# Patient Record
Sex: Male | Born: 1955 | Race: White | Hispanic: No | Smoking: Current every day smoker
Health system: Southern US, Community
[De-identification: ages and names within clinical notes are randomized; demographics above are authoritative.]

## PROBLEM LIST (undated history)

## (undated) DIAGNOSIS — F1010 Alcohol abuse, uncomplicated: Secondary | ICD-10-CM

## (undated) DIAGNOSIS — F13239 Sedative, hypnotic or anxiolytic dependence with withdrawal, unspecified: Secondary | ICD-10-CM

## (undated) DIAGNOSIS — F13939 Sedative, hypnotic or anxiolytic use, unspecified with withdrawal, unspecified: Secondary | ICD-10-CM

## (undated) DIAGNOSIS — R569 Unspecified convulsions: Secondary | ICD-10-CM

## (undated) DIAGNOSIS — J449 Chronic obstructive pulmonary disease, unspecified: Secondary | ICD-10-CM

## (undated) DIAGNOSIS — M5030 Other cervical disc degeneration, unspecified cervical region: Secondary | ICD-10-CM

## (undated) DIAGNOSIS — E119 Type 2 diabetes mellitus without complications: Secondary | ICD-10-CM

## (undated) HISTORY — PX: BACK SURGERY: SHX140

## (undated) HISTORY — PX: NECK SURGERY: SHX720

---

## 2000-06-03 ENCOUNTER — Emergency Department (HOSPITAL_COMMUNITY): Admission: EM | Admit: 2000-06-03 | Discharge: 2000-06-03 | Payer: Self-pay | Admitting: Emergency Medicine

## 2000-06-27 ENCOUNTER — Encounter: Admission: RE | Admit: 2000-06-27 | Discharge: 2000-09-25 | Payer: Self-pay | Admitting: Unknown Physician Specialty

## 2001-05-18 ENCOUNTER — Encounter: Payer: Self-pay | Admitting: Emergency Medicine

## 2001-05-18 ENCOUNTER — Inpatient Hospital Stay (HOSPITAL_COMMUNITY): Admission: EM | Admit: 2001-05-18 | Discharge: 2001-05-23 | Payer: Self-pay | Admitting: *Deleted

## 2001-06-19 ENCOUNTER — Inpatient Hospital Stay (HOSPITAL_COMMUNITY): Admission: EM | Admit: 2001-06-19 | Discharge: 2001-06-25 | Payer: Self-pay | Admitting: Psychiatry

## 2001-11-07 ENCOUNTER — Encounter: Admission: RE | Admit: 2001-11-07 | Discharge: 2001-11-07 | Payer: Self-pay | Admitting: Sports Medicine

## 2001-11-07 ENCOUNTER — Encounter: Payer: Self-pay | Admitting: Sports Medicine

## 2001-11-21 ENCOUNTER — Encounter: Admission: RE | Admit: 2001-11-21 | Discharge: 2001-11-21 | Payer: Self-pay | Admitting: Sports Medicine

## 2001-11-21 ENCOUNTER — Encounter: Payer: Self-pay | Admitting: Sports Medicine

## 2001-12-05 ENCOUNTER — Encounter: Payer: Self-pay | Admitting: Sports Medicine

## 2001-12-05 ENCOUNTER — Encounter: Admission: RE | Admit: 2001-12-05 | Discharge: 2001-12-05 | Payer: Self-pay | Admitting: Sports Medicine

## 2002-09-27 ENCOUNTER — Ambulatory Visit (HOSPITAL_COMMUNITY): Admission: RE | Admit: 2002-09-27 | Discharge: 2002-09-28 | Payer: Self-pay | Admitting: Neurosurgery

## 2003-01-01 ENCOUNTER — Encounter: Payer: Self-pay | Admitting: Radiology

## 2003-01-01 ENCOUNTER — Encounter: Admission: RE | Admit: 2003-01-01 | Discharge: 2003-01-01 | Payer: Self-pay | Admitting: Neurosurgery

## 2003-01-18 ENCOUNTER — Encounter: Admission: RE | Admit: 2003-01-18 | Discharge: 2003-01-18 | Payer: Self-pay | Admitting: Neurosurgery

## 2003-04-25 ENCOUNTER — Inpatient Hospital Stay (HOSPITAL_COMMUNITY): Admission: RE | Admit: 2003-04-25 | Discharge: 2003-04-26 | Payer: Self-pay | Admitting: Neurosurgery

## 2003-10-12 ENCOUNTER — Ambulatory Visit (HOSPITAL_COMMUNITY): Admission: RE | Admit: 2003-10-12 | Discharge: 2003-10-12 | Payer: Self-pay | Admitting: Neurosurgery

## 2003-11-11 ENCOUNTER — Encounter
Admission: RE | Admit: 2003-11-11 | Discharge: 2004-02-09 | Payer: Self-pay | Admitting: Physical Medicine & Rehabilitation

## 2004-01-10 ENCOUNTER — Emergency Department (HOSPITAL_COMMUNITY): Admission: EM | Admit: 2004-01-10 | Discharge: 2004-01-10 | Payer: Self-pay | Admitting: Family Medicine

## 2004-01-14 ENCOUNTER — Encounter: Admission: RE | Admit: 2004-01-14 | Discharge: 2004-01-14 | Payer: Self-pay | Admitting: Neurosurgery

## 2004-01-29 ENCOUNTER — Encounter: Admission: RE | Admit: 2004-01-29 | Discharge: 2004-01-29 | Payer: Self-pay | Admitting: Neurosurgery

## 2004-02-03 ENCOUNTER — Emergency Department (HOSPITAL_COMMUNITY): Admission: EM | Admit: 2004-02-03 | Discharge: 2004-02-03 | Payer: Self-pay | Admitting: Emergency Medicine

## 2004-02-04 ENCOUNTER — Inpatient Hospital Stay (HOSPITAL_COMMUNITY): Admission: EM | Admit: 2004-02-04 | Discharge: 2004-02-06 | Payer: Self-pay | Admitting: Emergency Medicine

## 2004-02-14 ENCOUNTER — Encounter: Admission: RE | Admit: 2004-02-14 | Discharge: 2004-02-14 | Payer: Self-pay | Admitting: Neurosurgery

## 2004-02-21 ENCOUNTER — Emergency Department (HOSPITAL_COMMUNITY): Admission: EM | Admit: 2004-02-21 | Discharge: 2004-02-21 | Payer: Self-pay | Admitting: *Deleted

## 2004-04-30 ENCOUNTER — Emergency Department (HOSPITAL_COMMUNITY): Admission: EM | Admit: 2004-04-30 | Discharge: 2004-04-30 | Payer: Self-pay | Admitting: Emergency Medicine

## 2004-05-11 ENCOUNTER — Ambulatory Visit (HOSPITAL_COMMUNITY): Admission: RE | Admit: 2004-05-11 | Discharge: 2004-05-11 | Payer: Self-pay | Admitting: Neurosurgery

## 2004-05-14 ENCOUNTER — Emergency Department (HOSPITAL_COMMUNITY): Admission: AC | Admit: 2004-05-14 | Discharge: 2004-05-14 | Payer: Self-pay | Admitting: Emergency Medicine

## 2004-05-24 ENCOUNTER — Emergency Department (HOSPITAL_COMMUNITY): Admission: EM | Admit: 2004-05-24 | Discharge: 2004-05-24 | Payer: Self-pay | Admitting: Emergency Medicine

## 2005-02-03 ENCOUNTER — Emergency Department (HOSPITAL_COMMUNITY): Admission: EM | Admit: 2005-02-03 | Discharge: 2005-02-03 | Payer: Self-pay | Admitting: *Deleted

## 2006-05-02 ENCOUNTER — Ambulatory Visit: Payer: Self-pay | Admitting: Emergency Medicine

## 2006-05-02 ENCOUNTER — Inpatient Hospital Stay (HOSPITAL_COMMUNITY): Admission: EM | Admit: 2006-05-02 | Discharge: 2006-05-07 | Payer: Self-pay | Admitting: Emergency Medicine

## 2006-05-04 ENCOUNTER — Ambulatory Visit: Payer: Self-pay | Admitting: Gastroenterology

## 2006-05-07 ENCOUNTER — Inpatient Hospital Stay (HOSPITAL_COMMUNITY): Admission: AD | Admit: 2006-05-07 | Discharge: 2006-05-10 | Payer: Self-pay | Admitting: *Deleted

## 2006-05-07 ENCOUNTER — Ambulatory Visit: Payer: Self-pay | Admitting: Psychiatry

## 2007-06-06 ENCOUNTER — Ambulatory Visit: Payer: Self-pay | Admitting: Cardiology

## 2007-06-06 ENCOUNTER — Inpatient Hospital Stay (HOSPITAL_COMMUNITY): Admission: EM | Admit: 2007-06-06 | Discharge: 2007-06-14 | Payer: Self-pay | Admitting: *Deleted

## 2007-06-09 ENCOUNTER — Encounter: Payer: Self-pay | Admitting: Cardiology

## 2007-06-14 ENCOUNTER — Inpatient Hospital Stay (HOSPITAL_COMMUNITY): Admission: RE | Admit: 2007-06-14 | Discharge: 2007-06-23 | Payer: Self-pay | Admitting: *Deleted

## 2007-06-14 ENCOUNTER — Ambulatory Visit: Payer: Self-pay | Admitting: *Deleted

## 2011-01-26 NOTE — H&P (Signed)
NAMECONSTANTINE, RUDDICK           ACCOUNT NO.:  [id number]   MEDICAL RECORD NO.:  [id number]          PATIENT TYPE:  INP   LOCATION:  1235                         FACILITY:  [facility]   PHYSICIAN:  Della Goo, M.D. DATE OF BIRTH:  Sep 10, 1956   DATE OF ADMISSION:  06/06/2007  DATE OF DISCHARGE:                              HISTORY & PHYSICAL   This is an unassigned patient.   CHIEF COMPLAINT:  Lethargy.   HISTORY OF PRESENT ILLNESS:  This is a 55 year old male who is a  resident of an area group home who was sent to the emergency department  emergently after being found minimally arousable.  The patient was  reported as taking an undisclosed number of Xanax tablets.  He was  reported as being off to himself for the past 3 days and being severely  depressed.  He was reported as not coming out of his room.  He also had  decreased p.o. intake.  The patient is unwilling to give a history of  his current condition.  He is arousable on interview.   PAST MEDICAL HISTORY:  Only history available in that he is recovering  alcoholic.   MEDICATIONS:  None.   PAST SURGICAL HISTORY:  Unknown at this time.   ALLERGIES:  NO KNOWN DRUG ALLERGIES.   SOCIAL HISTORY:  Currently nonsmoker, nondrinker.   FAMILY HISTORY:  Unable to obtain.   REVIEW OF SYSTEMS:  Negative by report other than lethargy.   PHYSICAL EXAMINATION FINDINGS:  This is a disheveled, well-nourished,  well-developed male in no acute distress.  He is arousable to verbal.  HEENT EXAMINATION:  Normocephalic, atraumatic.  Pupils equally round,  reactive to light.  Extraocular muscles are intact.  Funduscopic benign.  Oropharynx:  Poor oral hygiene, positive dry mucosa.  NECK:  Supple, full range of motion.  No thyromegaly, adenopathy or  jugular venous distention.  CARDIOVASCULAR:  Regular rate and rhythm.  No murmurs, gallops or rubs.  LUNGS:  Clear to auscultation bilaterally.  ABDOMEN:  Positive bowel sounds, soft,  nontender, nondistended.  EXTREMITIES:  Without cyanosis, clubbing or edema.  NEUROLOGIC EXAMINATION:  The patient is arousable.  His speech is clear.  He is very agitated and angry.  He can move all four of his extremities  and there are no focal deficits.   LABORATORY STUDIES:  Urine drug screen positive for opiates and positive  for benzodiazepines.   ASSESSMENT:  This is a 55 year old male being admitted with altered  mental status and questionable Xanax overdose.  Also found to have a  pneumonia on chest x-ray with hypotension, leukocytosis, and early  sepsis.   PLAN:  The patient will be admitted, placed on IV antibiotic therapy of  vancomycin and Zosyn.  He will also be placed on nebulizer treatments  and supplemental oxygen therapy.  IV fluids have been ordered for  maintenance and rehydration therapy.  Serial blood glucose levels will  be checked and monitored and covered p.r.n.  A one-to-one sitter has  been requested for patient's safety and suicide precautions.  The group  home will be contacted if possible.  DVT and  GI prophylaxis have also  been ordered and a psych evaluation will be requested.  A CAT scan of  the brain has also been ordered.  This is ordered without contrast      Della Goo, M.D.  Electronically Signed     HJ/MEDQ  D:  06/07/2007  T:  06/08/2007  Job:  [id number]

## 2011-01-26 NOTE — Discharge Summary (Signed)
NAMETAKAHIRO, [name]           ACCOUNT NO.:  [id number]   MEDICAL RECORD NO.:  [id number]          PATIENT TYPE:  INP   LOCATION:  1408                         FACILITY:  [facility]   PHYSICIAN:  Altha Harm, MDDATE OF BIRTH:  Oct 13, 1955   DATE OF ADMISSION:  06/06/2007  DATE OF DISCHARGE:                               DISCHARGE SUMMARY   Date of discharge not yet determined.   FINAL DISCHARGE DIAGNOSES:  1. Xanax overdose.  2. Bipolar disorder.  3. Chronic back pain.  4. Left lower lobe pneumonia.  5. Bradycardia, asymptomatic.   MEDICATIONS AT DISCHARGE:  1. OxyContin 10 mg p.o. q.12h.  2. Oxycodone 5 mg p.o. q.4-6h. p.r.n.  3. Ativan 1 mg p.o. q.12h. p.r.n.   CONSULTANTS:  Dr. Jeanie Sewer, psychiatry.   PROCEDURES:  None.   DIAGNOSTIC STUDIES:  1. Two-D echocardiogram performed on June 09, 2007, which shows      overall normal left ventricular ejection fraction with a range of      55-60%.  No left ventricular regional wall motion abnormalities.  2. Chest x-ray 2-view done on September 23, which shows patchy      airspace disease in the left lung base which could represent      atelectasis or early infection.  3. CT of the head without contrast which showed mild chronic ethmoid      sinusitis and no intracranial abnormality.   PRIMARY CARE PHYSICIAN:  Unassigned.   CHIEF COMPLAINT:  Lethargy.   HISTORY OF PRESENT ILLNESS:  Please see H&P for details of the HPI.   HOSPITAL COURSE:  1. Xanax toxicity.  The patient was admitted to the hospital after he      ingested approximately 30 Xanax.  The patient was placed in ICU      under observation and had resolution of symptoms from his Xanax      overdose.  The patient did have some bradycardia, and this was      investigated by cardiology who found it to be benign and      asymptomatic.  The patient was then evaluated by psychiatry who      recommended the patient be sent to inpatient Behavioral Health for     further evaluation and treatment.  Currently, the patient is      medically stable for transfer at any time that a bed becomes      available for inpatient Behavioral Health.  He is being discharged      on the above-stated medications.  2. In terms of his pneumonia, the patient has completed a course of      antibiotic and needs no further treatment at this time.  3. In terms of his chronic back pain, a long-acting narcotic,      OxyContin has been added to his regimen of medications, and the      patient will continue on 5 mg of immediate release oxycodone for      breakthrough pain.   The patient has no dietary or physical restrictions.      Altha Harm, MD  Electronically Signed  MAM/MEDQ  D:  06/10/2007  T:  06/10/2007  Job:  [id number]

## 2011-01-26 NOTE — Consult Note (Signed)
NAMEKONG, PACKETT           ACCOUNT NO.:  [id number]   MEDICAL RECORD NO.:  [id number]          PATIENT TYPE:  INP   LOCATION:  1235                         FACILITY:  [facility]   PHYSICIAN:  Luis Abed, MD, FACCDATE OF BIRTH:  Jun 04, 1956   DATE OF CONSULTATION:  06/09/2007  DATE OF DISCHARGE:                                 CONSULTATION   PRIMARY CARDIOLOGIST:  None   PRIMARY CARE PHYSICIAN:  None   CHIEF COMPLAINT:  Bradycardia.   HISTORY OF PRESENT ILLNESS:  Mr. Weinand is a 55 year old male with no  history of coronary artery disease.  He was admitted for lethargy on  June 07, 2007.  The cause of the lethargy was believed to be Xanax  overdose.  His heart rate was noted to be in the 30s at times overnight  and cardiology was asked to evaluate him.   Mr. Kreuser reports a long history of orthostatic dizziness.  This is  difficult to quantify or qualify, as he is chronically on  benzodiazepines.  He also states he gets chest pain two or three times a  week.  There is no exertional component, as he feels he can walk and  exert himself without any symptoms, but he does get dizzy in association  with anxiety.  He has had syncope in the past, but not since he  discontinued alcohol use.  He states he has not had any dizziness,  weakness or presyncope in the last 24 hours.  He also has a history of  tachy palpitations two to three times a week prior to admission, but has  also not had any of these since being in the hospital.  Currently, he is  resting comfortably.   PAST MEDICAL HISTORY:  1. Hyperglycemia.  2. Hypertension per old records although blood pressure is within      normal limits now.  3. Family history of coronary artery disease (not premature).  4. Ongoing tobacco use.  5. Remote history of ETOH and drug abuse (polysubstance abuse).  6. Remote history of withdrawal seizures.  7. History of multiple suicide attempts.  8. History of anxiety and panic  attacks.   SURGICAL HISTORY:  He has had surgery on his C spine and L spine.   ALLERGIES:  No known drug allergies.   CURRENT MEDICATIONS:  1. IV fluid with D5 half-normal saline at 125 ml an hour.  2. DVT Lovenox.  3. Sliding-scale insulin.  4. Atrovent nebs p.r.n.  5. Protonix 40 mg a day.  6. Zosyn q.6 hours.  7. Potassium 20 mEq x1.  8. Vancomycin 1250 mg q.12 hours.  9. Augmentin 500 mg t.i.d.   SOCIAL HISTORY:  He is currently living in a group home.  He is  unemployed, as he was laid off about two weeks ago.  He has a greater  than 50-pack-year history of tobacco use and still smokes one pack a  day.  He has been off alcohol three years and has not used heroin or  crack for five years, however he has within the past three years been  admitted for benzodiazepine abuse.  FAMILY HISTORY:  His parents are both alive in their 13's and his father  does have a history of coronary artery disease.  He has no siblings with  coronary artery disease and no one in his family has a pacemaker of  which he is aware.   REVIEW OF SYSTEMS:  He is denying fevers, chills or sweats.  He is  edentulous.  He is not able to quantify or qualify the chest pain.  He  has some chronic dyspnea on exertion, but this has not changed recently.  He states he woke up in the night and thinks it might have been either  anxiety or shortness of breath recently, but this is not usual.  He  hears himself wheeze and he coughs regular and his cough is normally  productive of yellowish sputum.  He has problems with depression,  anxiety an insomnia.  He has chronic musculoskeletal pain, especially in  his back.  Full 14-point review of systems is otherwise negative.   PHYSICAL EXAMINATION:  VITAL SIGNS:  Temperature is 99.0, blood pressure  113/47, pulse 45, respiratory rate 20, O2 saturation 97% on room air.  GENERAL:  He is a well-developed, well-nourished white male in no acute  distress.  HEENT:  Normal  with the exception of pupils being mid-ranged and  minimally reactive, as well as the patient being edentulous.  NECK:  There is no JVD, no lymphadenopathy, no thyromegaly and no bruits  are appreciated.  CV:  His heart is regular in rate and rhythm with an S1, S2 and no  significant murmur, rub or gallop is noted.  Distal pulses are intact in  all four extremities and he has a faint right bruit.  LUNGS:  He has some rales at the bases, but no wheezes noted.  SKIN:  He has some ecchymotic areas from IV sticks and blood draws, but  otherwise all surgical scars are well healed.  ABDOMEN:  Soft and nontender with active bowel sounds.  EXTREMITIES:  There is no cyanosis, clubbing or edema noted.  MUSCULOSKELETAL:  There is no joint deformity or effusions, and no spine  or CVA tenderness.  NEURO:  Alert and oriented x3 with cranial nerves II-XII grossly intact.   Chest x-ray showed patchy airspace disease, atelectasis versus early  infection.   EKG:  Sinus rhythm, rate 85 with no acute ischemic changes.  Telemetry  is currently showing sinus bradycardia with a heart rate in the 40s and  50s and overnight it occasionally dropped into the 30s.  The only ectopy  seen was a few PACs.   LABORATORY VALUES:  Hemoglobin 12.3, hematocrit 36.1, WBC 8.9, platelets  166.  Sodium 145, potassium 4.0, chloride 114, CO2 25, BUN 3, creatinine  0.88, glucose 113, TSH 0.24.  Urine drug screen was positive for opiates  and benzos.  CK number one 2263/16.8 with an index of 0.01; number two  1956/11.4, index 0.01; CK number three 1578; number four 989; troponin I  0.04 x2.   IMPRESSION:  Bradycardia.  He is not in a high-grade heart block and all  strips are sinus rhythm.  He is not hypotensive with this.  Because of  his acute event and because there are no clear symptoms associated with  the low heart rate, we will follow him on telemetry.  An echocardiogram  will be checked.  If his blood pressure  remains  stable and his echocardiogram does not show any significant  abnormalities, no further workup is  indicated at this time.  Further  laboratory screening for hyperthyroidism has already been ordered and he  is being followed closely by primary care for other issues.      Theodore Demark, PA-C      Luis Abed, MD, [facility]  Electronically Signed    RB/MEDQ  D:  06/09/2007  T:  06/09/2007  Job:  [id number]

## 2011-01-26 NOTE — Consult Note (Signed)
[name]           ACCOUNT NO.:  [id number]   MEDICAL RECORD NO.:  [id number]          PATIENT TYPE:  INP   LOCATION:  1235                         FACILITY:  [facility]   PHYSICIAN:  Antonietta Breach, M.D.  DATE OF BIRTH:  Jun 29, 1956   DATE OF CONSULTATION:  06/08/2007  DATE OF DISCHARGE:                                 CONSULTATION   REASON FOR CONSULTATION:  Overdose.   REQUESTING PHYSICIAN:  [facility].   [name] is a 55 year old divorced male [facility] June [facility], 2007, after an overdose.   [name] overdosed on an unknown amount of Xanax.  He had been hoarding  it.  He was living in a group home and had not come out of his room for  3 days.  The patient continues with impaired judgment.  He acknowledges  that it was an intentional overdose.  His energy is decreased.  His mood  is depressed.  He is not having any hallucinations or delusions.  His  concentration is decreased.   The patient is in the ICU and has pneumonia.  He will still require a  telemetry bed after being transferred out of the ICU.   PAST PSYCHIATRIC HISTORY:  [name] does have a history of multiple  suicide attempts in the past. In August of 2007 he drank floor cleaner  and combined that with a benzodiazepine overdose which required a  general medical admission followed by a psychiatric admission.  There is  some question as to whether the patient has bipolar disorder.  He has a  history of abusing various substances including cocaine, heroin, and  alcohol.  He has lived at the [facility], and has been in [facility] programs before.   PAST PSYCHOTROPIC HISTORY INCLUDES:  Zyprexa, Seroquel, Neurontin.  The  patient does not recall his current psychotropic regimen.  He states  that [facility]   FAMILY PSYCHIATRIC HISTORY:  None known.   SOCIAL HISTORY:  The patient  is currently unemployed.  He used to work  at [employer].  He is divorced.  He is living in a group home.  His  mother has been visiting the hospital.   PAST MEDICAL HISTORY:  As above.   MENTAL STATUS EXAM:  [name] is a middle-aged male lying in the supine  position in his hospital bed.  His attention span is mildly decreased.  His concentration is mildly decreased.  His affect is constricted.  His  mood is depressed.  He is oriented to all spheres.  His speech is still  a little slurred.  Thought process is coherent.  Thought content:  He  does acknowledge that this was an intentional overdose.  He has no  delusions or hallucinations.  His judgment is impaired.  His insight is  poor.   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  Polysubstance dependence.  AXIS II:  Deferred.  AXIS III:  See general medical problems.  AXIS IV:  Primary support group.  AXIS V:  20.   [name] demonstrates impaired judgment, poor insight.  He also is  assessed to be at risk to harm himself, due to his impaired judgment.  He also is at risk for lethal self-neglect.   RECOMMENDATIONS:  1. Once the patient is medically cleared, would admit him to a      psychiatric unit for dual-diagnosis treatment.  2. Would continue the sitter while on the medical floor.  3. The undersigned has requested that the social worker research which      pharmacy and physician have been handling the patient's medicines      to come up with his most recent psychotropic regimen.  Once the      patient is medically cleared to restart a psychotropic regimen, the      list can help in decisions for his current psychotropic regimen      based on what he has been on in the past.      Antonietta Breach, M.D.  Electronically Signed     Antonietta Breach, M.D.  Electronically Signed    JW/MEDQ  D:  06/08/2007  T:  06/09/2007  Job:  [id number]

## 2011-01-26 NOTE — Consult Note (Signed)
NAMEJAROD, BOZZO           ACCOUNT NO.:  [id number]   MEDICAL RECORD NO.:  [id number]          PATIENT TYPE:  INP   LOCATION:  1408                         FACILITY:  [facility]   PHYSICIAN:  Antonietta Breach, M.D.  DATE OF BIRTH:  18-Dec-1955   DATE OF CONSULTATION:  06/12/2007  DATE OF DISCHARGE:                                 CONSULTATION   Mr. [name] continues with both auditory and visual hallucinations that  are disturbing.  He states that the auditory hallucinations are  derogative, verbal.   He is having insomnia, low energy, depressed mood, anhedonia, and  suicidal thoughts.  He is oriented to all spheres.  His memory function  is intact, and he is cooperative with bedside care.   PAST PSYCHIATRIC REVIEW:  The patient does not recall any history of  decreased need for sleep and increased energy.  He does have a history  of extended periods of wanting to be able to sleep but not being able  to.  He also describes periods where he has had hallucinations when his  mood is not depressed.  He states that the depression makes them worse.   REVIEW OF SYSTEMS:  ENDOCRINE-METABOLIC:  The patient's T3 and T4 were  both within normal limits even though his TSH was mildly decreased.  CARDIOVASCULAR:  The patient did have 3 beats of ventricular tachycardia  today.  His current QTC on telemetry is 410 msec.  GASTROINTESTINAL:  The patient ate 50% of his breakfast, 75% of his lunch.  PSYCHIATRIC:  The patient has required Ativan 1 mg 4 times today due to feeling on  edge.   PHYSICAL EXAMINATION:  VITAL SIGNS:  Temperature 99.0, pulse 75,  respiratory rate 18, blood pressure 116/71, O2 saturation on room air  96%.   MENTAL STATUS EXAM:  Mr. Reading is alert.  His attention span is within  normal limits.  His eye contact is good.  His concentration is  decreased.  His affect is very constricted.  His mood is anxious.  He is  oriented to all spheres.  His memory is intact to immediate,  recent, and  remote.  His speech is soft.  There is no dysarthria.  He does have  normal prosody.  Though process is logical, coherent, goal directed.  No  looseness of associations.  Thought content:  He has suicidal ideation.  He is having hallucinations as described in the history above.  His  judgment is impaired.  His insight is partial.   ASSESSMENT:  AXIS I:  1. 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  2. Rule out 293.70) schizoaffective disorder, depressed, with      psychotic features.  3. Major depressive disorder, recurrent.  4. (293.94) Anxiety disorder not otherwise specified.   RECOMMENDATIONS:  1. Would start Celexa for anti-depression, anti-anxiety at 10 mg      q.a.m. and then would titrate as tolerated to 40 mg q.a.m.  2. If medically cleared regarding the patient's cardiac history today      by the primary care physician, would start      Zyprexa 5 mg p.o. nightly for  anti-psychosis and augmenting Celexa.      Would then recheck his QTC.  3. Would continue the sitter.  4. Would admit to a psychiatric unit when medically cleared and      psychiatric bed available.      Antonietta Breach, M.D.  Electronically Signed     JW/MEDQ  D:  06/12/2007  T:  06/12/2007  Job:  [id number]

## 2011-01-26 NOTE — Consult Note (Signed)
[name], [name]           ACCOUNT NO.:  [id number]   MEDICAL RECORD NO.:  [id number]          PATIENT TYPE:  INP   LOCATION:  1408                         FACILITY:  [facility]   PHYSICIAN:  Antonietta Breach, M.D.  DATE OF BIRTH:  1956/01/05   DATE OF CONSULTATION:  06/08/2007  DATE OF DISCHARGE:                                 CONSULTATION   ADDENDUM:   REVIEW OF SYSTEMS:  CARDIOVASCULAR:  The patient has hypertension.  ENDOCRINE/METABOLIC:  The patient has had some hyperglycemia.  He has a  question of diabetes.  His glucose was 154.  His potassium was 2.9.  BUN  was 4, creatinine 0.79.  HEMATOLOGIC/LYMPHATIC:  WBC 8.9, hemoglobin  12.3, platelet count 166.  HEAD:  No trauma.  EYES:  No visual changes.  EARS:  No hearing impairment.  NOSE:  No rhinorrhea.  MOUTH/THROAT:  No  sore throat.  NEUROLOGIC:  Head CT showed no acute abnormalities.  PSYCHIATRIC:  As above.  GASTROINTESTINAL:  No vomiting.  GENITOURINARY:  No dysuria.  SKIN:  Unremarkable.  MUSCULOSKELETAL:  No deformities.   ALLERGIES:  NO KNOWN DRUG ALLERGIES.   MEDICATIONS:  Ativan one half to one q.4 h p.r.n.      Antonietta Breach, M.D.  Electronically Signed     JW/MEDQ  D:  06/13/2007  T:  06/13/2007  Job:  [id number]

## 2011-01-26 NOTE — H&P (Signed)
[name], [name] NO.:  [id number]   MEDICAL RECORD NO.:  [id number]          PATIENT TYPE:  IPS   LOCATION:  0301                          FACILITY:  [facility]   PHYSICIAN:  [name], M.D. DATE OF BIRTH:  01-30-1956   DATE OF ADMISSION:  06/14/2007  DATE OF DISCHARGE:                       PSYCHIATRIC ADMISSION ASSESSMENT   IDENTIFYING INFORMATION:  This is a 55 year old male voluntarily  admitted on June 14, 2007.   HISTORY OF PRESENT ILLNESS:  The patient presents with a history of  intentional overdose on 90 Xanax tablets and 90 oxycodone tablets.  The  patient was assessed and monitored at [facility].  He was  ventilator-dependent for some period of time.  He apparently was found  by another resident at the [facility] where this patient has been  residing.  The patient reports using a month supply of his medications  within a one-week period of time and then buys opiates and  benzodiazepines off the street.  His stressors are that he is very  lonely and there is a possibility that he may be laid off from work.   PAST PSYCHIATRIC HISTORY:  He was here prior.  Has been on Seroquel in  the past and has been an outpatient at [facility].   SOCIAL HISTORY:  This is a 55 year old single male, living at the [facility], has been laid off from work, has no children, has elderly parents  but no other family close.   FAMILY HISTORY:  None.   ALCOHOL/DRUG HISTORY:  As described above.  Has no IV drug use but again  has been abusing Xanax and oxycodone.   PRIMARY CARE PHYSICIAN:  Dr. Tenny Craw.   MEDICAL PROBLEMS:  History of seizures, chronic back pain and was  diagnosed with pneumonia while he was in the hospital with his overdose.   MEDICATIONS:  Has been on OxyContin 10 mg t.i.d., oxycodone 5 mg q.4h.  p.r.n. for breakthrough pain, Celexa 20 mg daily.   ALLERGIES:  No known allergies.   PHYSICAL EXAMINATION:  This is a  middle-aged male.  He is currently in  no distress.  He is complaining of feeling fuzzy.  His temperature is  98, heart rate 73, respirations 24, blood pressure is 129/85, weight 174  pounds.  He is 5 feet 7 inches tall.   LABORATORY DATA:  TSH is 0.319.  BUN is 3.  Liver function was within  normal limits.  Alcohol level was less than 5.  Urine drug screen is  positive for opiates and positive for benzodiazepines.   MENTAL STATUS EXAM:  He is fully alert, cooperative, good eye contact,  neatly dressed.  Speech is clear, normal pace and tone.  The patient's  mood is neutral.  The patient appears sad.  He gets a little teary-eyed  talking about his predicament with his job and lack of support.  Thought  processes are coherent.  There is no evidence of any thought disorder.  Cognitive function is aware of himself and his situation.  His  concentration is somewhat decreased.  Judgment and insight are fair.  Appears sincere.   DIAGNOSES:  AXIS I:  Major depressive disorder, severe, recurrent.  Opiate dependence.  Benzodiazepine dependence.  AXIS II:  Deferred.  AXIS III:  Chronic back pain, status post pneumonia, seizure disorder.  AXIS IV:  Problems with occupation, other psychosocial problems related  to lack of support, socially isolated, medical problems.  AXIS V:  Current 40.   PLAN:  Contract for safety.  Stabilize mood and thinking.  We will  continue to monitor for withdrawal symptoms.  Put patient on seizure  precautions.  Have Librium available for withdrawal symptoms.  We will  continue with the patient's antidepressant.  Casemanager is to assess  follow-up.   TENTATIVE LENGTH OF STAY:  Four to six days.      [name], N.P.      [name], M.D.  Electronically Signed    JO/MEDQ  D:  06/19/2007  T:  06/19/2007  Job:  [id number]

## 2011-01-26 NOTE — Discharge Summary (Signed)
NAMEVICKY, MCCANLESS NO.:  [id number]   MEDICAL RECORD NO.:  [id number]          PATIENT TYPE:  IPS   LOCATION:  0301                          FACILITY:  [facility]   PHYSICIAN:  Jasmine Pang, M.D. DATE OF BIRTH:  12-23-1955   DATE OF ADMISSION:  06/14/2007  DATE OF DISCHARGE:  06/23/2007                               DISCHARGE SUMMARY   IDENTIFYING INFORMATION:  This is a 55 year old white male who was  voluntarily admitted on June 14, 2007.   HISTORY OF PRESENT ILLNESS:  The patient presented with a history of  intentional overdose on 90 Xanax tablets and 90 oxycodone tablets.  The  patient was assessed and monitored at [facility].  He was  ventilator-dependent for some period of time.  He apparently was found  by another resident at the [facility] where the patient had been  residing.  The patient reports using a month's supply of his medications  within 1 week period of time.  He did buy his opiates and  benzodiazepines off the streets.  His stressors are that he is very  lonely and there is a possibility that he may be laid off from work.  The patient has been hospitalized on this unit in the past.  He has been  on Seroquel in the past.  He has been an outpatient at the [facility]  He is 55 year old single male living in an  [address].  He has been laid off from work.  He has no children.  He  has elderly parents, but no other family close.  He has no IV drug use,  but has been abusing Xanax and oxycodone.  The patient has a history of  seizures, chronic back pain, and was diagnosed with pneumonia while he  was in the hospital with his overdose.  He has been on OxyContin 10 mg  t.i.d., oxycodone 5 mg q. 4h. p.r.n. for breakthrough pain, and Celexa  20 mg daily.  He has no known drug allergies.   PHYSICAL EXAMINATION:  This is a middle-aged male.  He is currently in  no distress, in no acute physical or medical  problems.   ADMISSION LABORATORIES:  TSH was 0.319.  BUN was 3.  Liver function was  within normal limits.  Alcohol level was less than 5.  Urine drug screen  was positive for opiates and positive for benzodiazepines.   HOSPITAL COURSE:  Upon admission, the patient was continued on his  Celexa 10 mg daily, OxyContin 10 mg b.i.d., oxycodone 5 mg p.o. q. 4h.  p.r.n. pain.  He was also placed on Ativan 0.5 mg p.o. q. 6h. p.r.n.  anxiety, and trazodone 50 mg p.o. q.h.s. p.r.n. insomnia.  He was placed  on Librium detox protocol.  On June 16, 2007, he was placed on Effexor  XR 75 mg now and then 75 mg q.a.m.  His Seroquel was increased to 100 mg  p.o. q.i.d.  On June 19, 2007, Seroquel was increased to 150 mg p.o.  t.i.d. and 200 mg p.o. q.h.s.  The patient was friendly and  cooperative.  He participated appropriately in unit therapeutic groups and activities.  He spoke openly with me about his overdose.  He states he took a whole  bunch of pills.  He stated he did not want to live anymore.  He had to  go to the ICU and was on a respirator.  He lives at recovery house.  His  stressors were that he has lost his job and he has been depressed.  He  has felt it would not be bad if he died.  He continued to feel depressed  and anxious.  As the hospitalization progressed, he maintains some  passive suicidal ideation stating he wishes he was dead.  He was anxious  as well as depressed.  He was worrying about where he was going to live  when he gets out.  He wanted to go back to an [facility], but found  out that they would not take him back because he has had three suicide  attempts while residing in an [address].  As hospitalization  progressed, mental status improved.  He did state he had some shakes  from withdrawal, but there was no obvious physical problem noted.  He  was somewhat paranoid around other people.  He said he also is worried  about racing thoughts. His Seroquel was  increased, as indicated above to  treat this.  He was struggling with where he was going to live when he  left because he did not want to be far away from his family.  He was  given menu of places to call, but was unable to find an opening.  He  finally decided he would return home to live with his parents for few  days until he could get into a program in [place] that he was  interested in getting in.  On June 23, 2007, mental status had  improved markedly from admission status.  The patient was friendly and  cooperative with good eye contact.  Speech was normal rate and flow.  Psychomotor activity was within normal limits.  Mood was euthymic.  Affect, wide range.  There was no suicidal or homicidal ideation.  No  thoughts of self-injurious behavior.  No auditory or visual  hallucinations.  No paranoia or delusions.  Thoughts were logical and  goal-directed.  Thought content, no predominant theme.  Cognitive was  grossly back to baseline.  The patient decided he was going to move in  with his parents [facility] for a while until he could get into the  treatment center in [place] that he was interested in.  Follow up  will be with the [facility].   DISCHARGE DIAGNOSES:  AXIS I:  Major depression, recurrent, severe  without psychosis, and polysubstance dependence.  AXIS II:  None.  AXIS III:  Chronic back pain, status post pneumonia.  AXIS IV:  Severe (problems with occupation and other psychosocial  problems related to lack of support socially, isolated medical problems,  and burden of psychiatric illness).  AXIS V:  Global assessment of functioning upon discharge was 50, global  assessment of functioning upon admission was 40, and global assessment  of functioning highest past year was 60-65.   DISCHARGE/PLAN:  There were no specific activity level or dietary  restrictions.   POSTHOSPITAL CARE PLAN:  The patient will be seen at the [facility] on June 26, 2007, at 9:00 a.m.  He was also  given a  list of AA meetings in his area.   DISCHARGE MEDICATIONS:  1. OxyContin 10 mg tablet SR 1 pill twice daily.  2. Seroquel 100 mg one and a half pill three times daily and two pills      at bedtime.  3. Effexor XR 75 mg daily.  4. Oxycodone 5 mg every 4 hours as needed for pain.      Jasmine Pang, M.D.  Electronically Signed     BHS/MEDQ  D:  06/23/2007  T:  06/24/2007  Job:  [id number]

## 2011-01-29 NOTE — Op Note (Signed)
NAME:  [name], COSENS                     ACCOUNT NO.:  [id number]   MEDICAL RECORD NO.:  [id number]                   PATIENT TYPE:  INP   LOCATION:  NA                                   FACILITY:  [facility]   PHYSICIAN:  Cristi Loron, M.D.            DATE OF BIRTH:  14-Jun-1956   DATE OF PROCEDURE:  04/25/2003  DATE OF DISCHARGE:                                 OPERATIVE REPORT   PREOPERATIVE DIAGNOSES:  L5-S1 spondylolisthesis, degenerative disk disease,  L5 spondylolysis, lumbago, lumbar radiculopathy.   POSTOPERATIVE DIAGNOSES:  L5-S1 spondylolisthesis, degenerative disk  disease, L5 spondylolysis, lumbago, lumbar radiculopathy.   PROCEDURES:  1. L5 Gill procedure.  2. L5-S1 posterior lumbar interbody fusion.  3. Insertion of bilateral L5-S1 tangent bone prostheses.  4. Posterior nonsegmental instrumentation with CD Horizon M10 titanium     pedicle screws and rods, L5-S1.  5. L5-S1 posterolateral arthrodesis with local morcellized autograft bone     and Vitoss bone scaffolding.   SURGEON:  Cristi Loron, M.D.   ASSISTANT:  Clydene Fake, M.D.   ANESTHESIA:  General endotracheal.   ESTIMATED BLOOD LOSS:  250 mL.   SPECIMENS:  None.   DRAINS:  None.   COMPLICATIONS:  None.   BRIEF HISTORY:  The patient is a 55 year old white male who has suffered  from intractable back and leg pain.  He was worked up with a lumbar MRI,  which demonstrated an L5 spondylolysis and L5-S1 spondylolisthesis with  degenerative disk disease.  As he has failed extensive nonsurgical  management, I have discussed the various treatment options with him  including surgery.  The patient weighed the risks, benefits, and  alternatives of surgery and decided to proceed with a lumbar decompression  and fusion.   DESCRIPTION OF PROCEDURE:  The patient was brought to the operating room by  the anesthesia team.  General endotracheal anesthesia was induced.  The  patient was then  turned in a prone position on the Wilson frame.  His  lumbosacral region was then shaved and prepared with Betadine scrub and  Betadine solution.  Sterile drapes were applied.  I then injected the area  to be incised with Marcaine with epinephrine solution.  I then used a  scalpel to make a linear midline incision over the L5-S1 interspace.  I used  electrocautery to dissect down to the thoracolumbar fascia, dividing the  fascia bilaterally and performing a bilateral subperiosteal dissection,  stripping the paraspinous musculature from the spinous process and lamina of  L4, L5, and the upper sacrum.  I inserted the McCullough retractor for  exposure and then obtained the intraoperative radiograph to confirm our  location.  We began the L5 Gill procedure by incising the L4-5 and L5-S1  interspinous ligament with the scalpel.  We then used the Leksell rongeur to  remove the spinous process and part of the lamina and facet of L5.  We saved  this  bone for later use in the fusion process after it had been cleared of  soft tissues.  We then used the high-speed drill to perform bilateral L5  laminotomies, and then we completed the L5 laminectomy and removal of the  medial aspect of the L5 pars region/Gill procedure bilaterally and performed  our foraminotomy and removed the ligamentum flavum at L4-5 and L5-S1.  We  performed generous foraminotomies about the bilateral L5-S1 nerve root,  completing the Gill procedure.   We then mobilized the thecal sac and the S1 nerve roots and gently retracted  them medially with the D'Errico retractor and then used the scalpel,  Scoville and Epstein curettes to perform an aggressive bilateral L5-S1  diskectomy.   We then prepared for the posterior lumbar interbody fusion by using the  medium rotating curette and chisel to prepare the vertebral end plates at L5-  S1, of course after retracting the neural structures out of harm's way.  We  then inserted a 10 x  24 mm Tangent bone prosthesis bilaterally into the L5-  S1 interspace, again after retracting the neural structures out of harm's  way.  We then packed in between the Tangent bone prostheses with local  morcellized autograft bone and Vitoss bone scaffolding.  I should mention  that we distracted the interspace contralaterally while we put the bone  graft in.   We now turned our attention to the posterior nonsegmental instrumentation.  We used electrocautery to expose the bilateral transverse process of L5 and  the sacral ala.  We then used the high-speed drill to decorticate posterior  bilateral L5 and S1 pedicles under fluoroscopic guidance and then cannulated  the pedicles with a bone probe, tapped the pedicles, then probed inside the  tapped pedicles for cortical breaches and then inserted 6.5 x 55 mm pedicle  screws bilaterally at L5 and 6.5 x 45 mm pedicle screws bilaterally at S1,  again under fluoroscopic guidance.  We then palpated along the medial aspect  of the L5-S1 pedicles and noted there were no cortical breaches and that the  L5 and S1 nerve roots were not injured.  We then connected the unilateral  pedicle screws with a 30 mm lordotic rod and then slightly compressed the  construct and then secured the rod in place with the caps and tightened them  appropriately, completing the posterior nonsegmental instrumentation.   We now turned our attention to the posterolateral arthrodesis.  We used the  high-speed drill to decorticate the remainder of the L5 pars region, the  transverse processes bilaterally at L5 and S1 lamina-ala region. We then  laid a combination of local morcellized autograft bone and Vitoss bone  scaffolding over these decorticated posterolateral structures, completing  the posterolateral arthrodesis.   We then achieved stringent hemostasis using bipolar electrocautery.  I then reinspected the thecal sac and the bilateral L5 and S1 nerve roots and noted   that they were all decompressed, then removed the McCullough retractor and  then reapproximated the patient's thoracolumbar fascia with interrupted #1  Vicryl suture, the subcutaneous tissue with interrupted 2-0 Vicryl suture,  and the skin with Steri-Strips and Benzoin.  The wound was then coated with  bacitracin ointment and a sterile dressing applied, the drapes were removed,  and the patient was subsequently returned to a supine position, where he was  extubated by the anesthesia team and transported to the postanesthesia care  unit in stable condition.  All sponge, instrument, and needle counts were  correct at the end of this case.                                               Cristi Loron, M.D.    JDJ/MEDQ  D:  04/25/2003  T:  04/26/2003  Job:  [id number]

## 2011-01-29 NOTE — Discharge Summary (Signed)
[facility]  Patient:    [name], [name] Visit Number: [id number] MRN: [id number]          Service Type: PSY Location: [id number] Attending Physician:  Jeanice Lim Dictated by:   Reymundo Poll Dub Mikes, M.D. Admit Date:  06/19/2001 Discharge Date: 06/25/2001                             Discharge Summary  CHIEF COMPLAINT AND HISTORY OF PRESENT ILLNESS:  This was the second admission to [facility] for this 55 year old male who was admitted after he increased his use of Klonopin and started abusing again.  He was discharged from the inpatient unit on Klonopin 2 mg twice a day, increased up to 3 mg and 4 mg plus more twice a day.  He was drinking a lot.  He worked at home.  He used Klonopin.  He was also taking Zyprexa and Neurontin for his racing thoughts and he claimed that was better.  Denied any depression.  Did admit to panic attack which he attributed to Klonopin.  Last time he tried to quit Klonopin, he had a seizure.  He ran out this time and his family physician does not want to refill the medication, was sent for detoxification.  PAST PSYCHIATRIC HISTORY:  Sees Charlies Silvers, M.D. for the last two years.  Has been in [facility].  Has been clean from alcohol, two years from other drugs, but has been dependent on benzodiazepines.  SUBSTANCE ABUSE HISTORY:  Cocaine, heroin, Xanax up to three years ago and alcohol up to four years ago.  PAST MEDICAL HISTORY:  Denies history of any major medical conditions.  MEDICATIONS ON ADMISSION: 1. Neurontin 800 mg three times a day. 2. Zyprexa 15 mg twice a day. 3. Klonopin supposed to take 2 mg twice a day.  PHYSICAL EXAMINATION:  GENERAL:  Performed and failed to show any active findings.  MENTAL STATUS EXAMINATION ON ADMISSION:  Well-nourished, well-developed, alert, cooperative male, some psychomotor retardation, slow production.  Mood: Depression and anxiety.  Affect:  Depression and anxiety.  Thought processes: Logical, coherent, and relevant, not spontaneous.  No suicidal ideas, no homicidal ideas, wanting to get off the Klonopin.  Cognitive: Well preserved.  ADMITTING DIAGNOSES: Axis I:    1. Benzodiazepines dependence.            2. Panic attack disorder.            3. Bipolar disorder by history. Axis II:   No diagnosis. Axis III:  No diagnosis. Axis IV:   Moderate. Axis V:    Global assessment of functioning upon admission 30-35, highest            global assessment of functioning in the last year 65.  LABORATORY DATA:  Blood chemistries were within normal limits.  Liver profile was within normal limits.  Drug screen was positive for barbiturates.  HOSPITAL COURSE:  He was admitted and started in intensive individual and group psychotherapy.  He was detoxified using phenobarbital.  He was kept on his Zyprexa and his Neurontin.  He continued to complain of panic attacks as well as depression.  He was started on Lexapro 10 mg per day, was given trazodone for sleep.  He was started on Seroquel 25 mg twice a day and Seroquel 100 mg at bedtime and he was given Vistaril as needed for sleep. Seroquel was increased to 100  mg three times a day and 200 mg at bedtime.  He gradually settled down.  He complained of less anxiety.  Klonopin was completely removed.  He was kept on the combination of medication.  He was not going to be welcomed back to his place of stay so he had to find another place to go to.  He worked on relapse prevention and showed willingness to comply with treatment.  Upon discharge, fully detoxified from the benzodiazepines, in full contact with reality.  Mood: Improved.  Affect: Brighter.  No suicidal or homicidal ideas.  Motivated to pursue further outpatient followup.  DISCHARGE DIAGNOSES: Axis I:    1. Benzodiazepines dependence.            2. Bipolar disorder.            3. Anxiety disorder, not otherwise specified. Axis II:    No diagnosis. Axis III:  No diagnosis. Axis IV:   Moderate. Axis V:    Global assessment of functioning upon discharge 55-60.  DISCHARGE MEDICATIONS: 1. Zyprexa 15 mg twice a day. 2. Neurontin 800 mg three times a day. 3. Lexapro 10 mg every day. 4. Seroquel 100 mg three times a day and 200 mg at bedtime. 5. Ambien 10 mg as needed for sleep.  FOLLOWUP:  Charlies Silvers, M.D. Dictated by:   Reymundo Poll Dub Mikes, M.D. Attending Physician:  Jeanice Lim DD:  08/02/01 TD:  08/04/01 Job: [id number]
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
## 2011-01-29 NOTE — Discharge Summary (Signed)
NAMEWILLE, AUBUCHON                     ACCOUNT NO.:  [id number]   MEDICAL RECORD NO.:  [id number]                   PATIENT TYPE:  INP   LOCATION:  3009                                 FACILITY:  [facility]   PHYSICIAN:  Jonna L. Robb Matar, M.D.            DATE OF BIRTH:  04-Nov-1955   DATE OF ADMISSION:  02/03/2004  DATE OF DISCHARGE:  02/06/2004                                 DISCHARGE SUMMARY   CONSULTANTS:  1. Dr. Ranelle Oyster, chronic pain management.  2. Dr. Antonietta Breach for psychiatry.   FINAL DIAGNOSES:  1. Benzodiazepine overdose of a suicidal intent.  2. Hypertension.  3. Dehydration.  4. Chronic pain syndrome.  5. Adjustment reaction with mixed disturbance.  6. 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   ALLERGIES:  None.   CODE STATUS:  Full.   OPERATIONS:  None.   HISTORY:  This is a 55 year old white male who has been having problems with  chronic back pains for the past year and has been giving him difficulty with  sleeping, walking and he just decided he was tired to being dependent on  other people and being in pain.  He took 50 tablets of temazepam and  alprazolam and came in drowsy.   PAST MEDICAL HISTORY:  His past medical history is notable for:  1. Surgeries -- fusion and laminectomies at both the cervical and lumbar     levels.  2. One previous suicide attempt from 20 years ago.   SOCIAL HISTORY:  He is a 2-pack-per-day smoker.   PHYSICAL EXAM:  Physical exam was notable for slurred speech, blood pressure  of 87/50, dry mucous membranes, inspiratory crackles at both bases, scars  over the back, potassium of 3.2, albumin 3.3.   HOSPITAL COURSE:  The patient was admitted, hydrated, monitored with  neurological checks and had a sitter in the room.  The patient became alert  fairly quickly.  He was initially put on some Percocet and then evaluated by  Dr.  Riley Kill.  He gave him a transition dose of MS Contin and then put him on  fentanyl patch.  He was seen in consultation by psychiatry and we felt by  Feb 05, 2004, he no longer had suicidal intent.   Care management was also involved and the patient is having financial  problems and reported the reasons he has not gone back to be seen by  orthopedics.   DISPOSITION:  The patient will be discharged on:  1. Duragesic patch 50 mcg -- apply 1 every 3 days.  2. Xanax 1 mg t.i.d., which he has been on for months.   DISCHARGE INSTRUCTIONS:  He is to avoid any movement for his back strain.   FOLLOWUP:  He will get followup appointments with Dr. Kathrynn Running in 1-2 weeks  and Dr. Riley Kill in 3 weeks and I will  ask Case Management to help with  discharge medications and getting the patient evaluated and treatment  through [facility].                                                Jonna L. Robb Matar, M.D.    Dorna Bloom  D:  02/06/2004  T:  02/08/2004  Job:  [id number]   cc:   Dr. Grant Ruts, M.D.  [address] [place]  [place] [address]  Fax: [phone]   Cristi Loron, M.D.  [address]  [place]  [place] [address]  Fax: [phone]

## 2011-01-29 NOTE — Discharge Summary (Signed)
[facility]  Patient:    REA, RESER Visit Number: [id number] MRN: [id number]          Service Type: PSY Location: 50 0508 01 Attending Physician:  Jeanice Lim Dictated by:   Jeanice Lim, M.D. Admit Date:  06/19/2001 Discharge Date: 06/25/2001                             Discharge Summary  IDENTIFYING DATA:  This is a 55 year old Caucasian male who was voluntarily admitted via the [facility] after having a "psychotic episode."  The patient lives at [facility] and was found in the corner of his bathroom shaky, sweating, swatting at things and unresponsive. He was taken by police to [facility] Emergency Room, initially restrained by 5 people, handcuffed, combative, with guttural speech.  Then he became more loose and cooperative after 20 mg of IM Haldol.  He has a long history of polysubstance abuse, but reports that he has been clean and sober for the past 2 years on his current medications and is quite proud of his record.  His urine drug screen was negative in the emergency room.  He does have a past history of seizures when withdrawing from medications.  He admitted to taking more of the Klonopin than he was supposed to and then running out, possibly causing a withdrawal, agitation, delirium, and possible seizure activity.  PAST PSYCHIATRIC HISTORY:  The patient is followed by Andee Poles in [place].  History of prior inpatient admissions at [facility] 5 years ago and at [facility] 7 years ago.  He reports being clean and sober for the past 2 years on the current medications and proud of his abstinence.  He reports that he only recently started taking more Klonopin due to worsening of anxiety, but otherwise it has helped him be able to stay clean from other substances since it helps stabilize his mood and previously controlled his anxiety.  The patient has a past  history of abuse of heroin, crack cocaine, alcohol and benzodiazepine dependence.  He has been clean and sober.  His alcohol level was negative.  MEDICATIONS: 1. Klonopin 1.5 mg t.i.d. 2. Zyprexa 10 mg b.i.d. 3. gabitril 8 mg b.i.d.  DRUG ALLERGIES:  No known drug allergies.  PHYSICAL EXAMINATION:  Essentially within normal limits.  Vital signs were stable except for a temp of 101.3 and pulse of 115.  He had some bruising from the incident of the day before as well as muscle cramps, otherwise no acute distress.  Neurologically, he is grossly nonfocal and intact.  MENTAL STATUS EXAMINATION:  Significant for a cooperative white male, under the covers, feeling miserable, having muscle cramps, somewhat withdrawn. Speech was within normal limits.  He was anxious and mildly irritable. Thought process was logical and coherent.  Thought content was negative for psychotic symptoms.  He denied suicidal, homicidal, or violent ideation, and reported motivation to remain abstinent from substance abuse.  He reported motivation to be compliant with taking medications as prescribed, which he reports having done in the past until recently with the Klonopin.  ADMISSION DIAGNOSES: Axis I:    1. Benzodiazepine dependence.            2. History of heroin abuse, cocaine abuse, and alcohol abuse.            3. Bipolar disorder, relatively stable.  4. Anxiety disorder not otherwise specified.            5. 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 Axis II:   Deferred. Axis III:  Multiple superficial scrapes and contusions, leukocytosis. Axis IV:   Mild. Axis V:    45 at the time of admission, 75 in the past year.  LABS:  Routine labs for screening and admission labs to rule out a reversible organic etiology of his psychopathology were essentially within normal limits, including liver function tests, CBC with differential,  CMET, and thyroid profile with TSH of 2.43.  Urine tox in the emergency room again was negative, indicating that he had been off Klonopin for some time.  HOSPITAL COURSE:  The patient was restarted on medications and the phenobarbital protocol which was initiated was stopped and Klonopin was restarted, since his presentation was due to a "psychotic episode" which appeared to be related to benzodiazepine withdrawal, having run out of Klonopin.  The patient agreed to take medications as directed, to fill medications one week at a time, and case manager was to discuss follow up plan with outpatient psychiatrist and local pharmacy.  The patient initially was depressed with psychomotor retardation, complaining of significant anxiety even on Klonopin, and Klonopin was optimized.  His primary complaint was anxiety and panic attacks.  There was no seizure activity.  He improved with titration on Klonopin, optimizing Zyprexa for anxiety related to mood lability, and Trazodone was stopped due to lack of response.  The patient reported no side effects.  He had an episode of racing thoughts, agitation, and anxiety requiring Haldol 5 mg with positive response, and Zyprexa was continued at the increased dose for an adequate trial.  The patients anxiety markedly improved.  He denied panic symptoms, mood lability and dangerous ideation.  He reported good insight into the importance of taking medications as prescribed and the fact that if he continues to possibly abuse Klonopin that it will not be prescribed in the future, and he agreed to follow up arrangements to decrease risk of abuse.  CONDITION ON DISCHARGE:  The patient was improved, but not fully recovered at the time of discharge, and he agreed with the follow up plan. DISCHARGE MEDICATIONS:  He was given discharge prescriptions including: 1. Gabitril 4 mg 2 q.a.m. and 2 q.h.s. 2. Klonopin 2 mg 1 t.i.d. 3. Zyprexa 10 mg q.a.m. and 15 mg  q.h.s. 4. Ambien 10 mg at bedtime as needed for insomnia.  He was given a 1 week supply of Klonopin only, with refills.   POST HOSPITAL CARE PLANS:  The case was discussed with Dr. Nolen Mu at time of discharge, concern of patients abuse of Klonopin without reporting this to the psychiatrist was reviewed, and follow up plan was agreed upon to minimize risk of further abuse and give one more trial of stabilizing anxiety, irritability and mood lability with benzodiazepines.  He was to follow up with Dr. Nolen Mu September 12 at 4 p.m.  DISCHARGE DIAGNOSES:  Same as admitting diagnoses, including Axis I:    1. Bipolar disorder, mixed state.            2. Benzodiazepine dependence status post benzodiazepine               withdrawal, delirium and possible seizure activity.            3. History of  polysubstance abuse in remission. Axis II:   None. Axis III:  Same. Axis V:    55 upon discharge. Dictated by:   Jeanice Lim, M.D. Attending Physician:  Jeanice Lim DD:  06/28/01 TD:  06/30/01 Job: [id number]

## 2011-01-29 NOTE — H&P (Signed)
NAME:  [name], [name]                     ACCOUNT NO.:  [id number]   MEDICAL RECORD NO.:  [id number]                   PATIENT TYPE:  INP   LOCATION:  3009                                 FACILITY:  [facility]   PHYSICIAN:  Renato Battles, M.D.                  DATE OF BIRTH:  1956/07/05   DATE OF ADMISSION:  02/03/2004  DATE OF DISCHARGE:                                HISTORY & PHYSICAL   REASON FOR ADMISSION:  Suicide attempt with overdose.   HISTORY OF PRESENT ILLNESS:  The patient is a 55 year old white male who  took about 50 tablets of combination of temazepam 30 mg and alprazolam 2 mg  in attempt to kill himself.  He said he wanted to go to sleep and never wake  up.  The patient reports having problems with chronic back pain that he has  been putting up with for the last year.  He cannot work.  He cannot sleep or  walk. He cannot sleep as a result of his back pain.  He says he is tired of  dealing with it and wants to take his own life to get rid of the problem.  The patient came by the emergency room earlier on Feb 03, 2003, complaining  of back pain.  He was treated and discharged.  When the patient arrived in  the emergency room for the overdose, he brought with him two empty bottles  of temazepam and alprazolam.  I cannot read the date of filling these  prescriptions and how many the patient was supposed to be taking.  I  estimated that the patient should have had 36 tablets x 50 mg and 72 tablets  of alprazolam 2 mg left in the bottles for today.  I felt the patient had a  seriously high dose of benzodiazepines and wanted admission to monitor and  stabilize.   REVIEW OF SYSTEMS:  Except for back pain and drowsiness, Review of Systems  was essentially negative.   PAST MEDICAL HISTORY:  1. Back pain status post two fusion surgeries, laminectomy, cervical level.  2. Panic disorder.  3. Previous suicide attempt, although I do not have documentation of this.     This is per  patient report.   PAST SURGICAL HISTORY:  Back surgery x 2.   SOCIAL HISTORY:  He smokes two packs per day and has been doing so for the  last 32 years.  Denies drinking any alcohol or drugs.  He is currently  unemployed, used to be a Production designer, theatre/television/film.   FAMILY HISTORY:  Noncontributory.   ALLERGIES:  No known drug allergies.   HOME MEDICATIONS:  1. At this time, the patient is supposed to be on temazepam 50 mg p.o.     q.h.s.  2. Alprazolam 1 to 2 mg p.o. four times a day p.r.n.   PHYSICAL EXAMINATION:  GENERAL:  The patient is alert and oriented x  3.  He  has slurred speech, however.  VITAL SIGNS:  Temperature 97.1, heart rate 68, respiratory rate 24, blood  pressure 87/50.  HEENT:  Atraumatic and normocephalic.  Pupils equal, round, and reactive to  light  and accommodation. Extraocular movements intact bilaterally.  The  patient has dry mucous membranes.  NECK:  No adenopathy, thyromegaly, or JVD.  CHEST:  Clear to auscultation except for fine inspiratory crackles over both  bases.  HEART:  Regular rate and rhythm, no murmurs.  ABDOMEN:  Soft, nontender, nondistended, normoactive bowel sounds.  EXTREMITIES:  No cyanosis, edema, or clubbing.   LABORATORY AND X-RAY DATA:  Within normal limits except for low potassium of  3.2.  Liver function showed borderline AST at 45, borderline albumin of 3.3,  otherwise normal.  Complete blood count showed elevated hemoglobin at 15,  MCV 92.8, otherwise normal.  Salicylate level was less than 4 on two  readings 2 hours apart.  Acetaminophen level was less than 10, again on two  occasions about 2 hours apart.   Chest x-ray revealed left base scarring which was on previous exams.   IMPRESSION AND PLAN:  1. Suicide attempt.  The patient will be admitted with a sitter in the room.     Psychiatry consult will be requested, and patient shall be transferred to     psych once stable.  2. Benzodiazepine overdose.  I feel that patient took a  very high dose of     benzodiazepines which warrants monitoring for at least 24 hours.  Check     blood pressure and heart rate.  The patient will have neurologic checks     every 4 hours.  3. Hypertension.  This is most likely secondary to dehydration.  Going to     give patient IV fluids, monitor blood pressure every 4 hours, and will go     from there.  4. Back pain.  Going to check cervical and lumbosacral MRI.  Use Percocet to     help with pain control.  5. Panic disorder.  I am going to start the patient on Prozac and use Ativan     for acute episodes and agitation.  6. Significant smoking history.  Check PA and lateral chest x-ray and use     nebulizers as needed.  7. Borderline liver functions.  Again, this is most likely secondary to     dehydration.  I am going to just repeat the studies after rehydration.                                                Renato Battles, M.D.    SA/MEDQ  D:  02/04/2004  T:  02/04/2004  Job:  [id number]   cc:   Jeanice Lim, M.D.  Fax: [phone]   Dr. Lovell Sheehan

## 2011-01-29 NOTE — Discharge Summary (Signed)
[name], [name]           ACCOUNT NO.:  [id number]   MEDICAL RECORD NO.:  [id number]          PATIENT TYPE:  INP   LOCATION:  1502                         FACILITY:  [facility]   PHYSICIAN:  Michaelyn Barter, M.D. DATE OF BIRTH:  [birth year]   DATE OF ADMISSION:  06/06/2007  DATE OF DISCHARGE:  06/14/2007                               DISCHARGE SUMMARY   This is a final discharge dictation that will outline the events that  occurred only during the time frame of June 11, 2007, up until  June 14, 2007.  For events that occurred prior to September 28, please  see the discharge summary that was dictated by Dr. Marthann Schiller.   FINAL DIAGNOSES:  1. Xanax overdose.  2. Bipolar disorder.  3. Chronic back pain.  4. Left lower lobe pneumonia.  5. Asymptomatic bradycardia.   #1 - XANAX OVERDOSE.  The patient remained in the hospital during the  last few days waiting for placement into a psychiatric facility.  Psychiatry followed the patient during his hospitalization.  He remained  medically stable over the last 2-3 days of his hospitalization.   #2 - BRADYCARDIA.  The patient remained asymptomatic.  Cardiology  followed the patient.  The patient remained in sinus bradycardia.  Cardiology indicated that there was no indication for pacing as the  patient appeared to be asymptomatic.  Therefore, this was simply  monitored.   #3 - LEFT LOWER LOBE PNEUMONIA.  Again, this appeared to have resolved  during the course of his hospitalization.   By the date of discharge there were no new complaints.  The patient's  vital signs, his temperature was 98.2, heart rate 58, respirations 18,  blood pressure 118/72, O2 saturation 94% on room air.  The patient  appeared to be medically stable and later on October 1 the patient was  transferred to [facility].      Michaelyn Barter, M.D.  Electronically Signed     OR/MEDQ  D:  07/13/2007  T:  07/14/2007  Job:  [id number]

## 2011-01-29 NOTE — Discharge Summary (Signed)
NAMEMARCELLAS, [name] NO.:  [id number]   MEDICAL RECORD NO.:  [id number]          PATIENT TYPE:  IPS   LOCATION:  0403                          FACILITY:  [facility]   PHYSICIAN:  Jasmine Pang, M.D. DATE OF BIRTH:  Nov 23, 1955   DATE OF ADMISSION:  05/07/2006  DATE OF DISCHARGE:  05/10/2006                                 DISCHARGE SUMMARY   IDENTIFICATION:  This is a 55 year old divorced Caucasian male who was  admitted on a voluntary basis to my service on May 07, 2006.   HISTORY OF PRESENT ILLNESS:  Apparently the patient attempted suicide by  ingesting a floor cleaner and Xanax on August 19.  He suffered respiratory  failure and was in the ICU until he became appropriate for transfer to  [facility] on August 25.  He was seen in consult by Dr.  Jeanie Sewer on August 24.  Mr. Bonn was noted to continue feeling on edge.  He had decreased concentration, anhedonia, depressed mood, intermittent  sobbing and continued to have suicidal thoughts due to his misery.  He  appeared to be responding to internal stimuli although the patient denied  it.  He did have though blocking and he was oriented in all spheres.  Dr.  Jeanie Sewer felt he had a mood disorder not otherwise specified, major  depressive disorder, recurrent, severe and an anxiety disorder not otherwise  specified, and psychotic disorder not otherwise specified.  He recommended  Ativan p.r.n. for anxiety.  He would start Seroquel 25 mg b.i.d. and 50 mg  at bedtime with 50 mg at bedtime p.r.n. insomnia.  He started Lexapro 10 mg  p.o. q.a.m. and continued the suicide prevention measures on the inpatient  unit. towards the end, the patient was transferred here to [facility].   PHYSICAL EXAM:  Physical exam was well-documented in the chart.  This was  done at the [facility] medical intensive care unit.   ADMISSION LABORATORY:  These were done on the main medical unit at [facility].  TSH was obtained which  was 1.254 (0.35-5.5).  A CBC was grossly  within normal limits except for a decreased hemoglobin of 12.3 and  hematocrit of 36.3.  Routine chemistry panel was remarkable for a slightly  decreased potassium of 3.3.  Liver profile was grossly within normal limits  except for slightly decreased albumin of 3.4.  Lipase was 25 (22-51).  The  other labs were done at the [facility] Intensive Care Unit.   HOSPITAL COURSE:  Upon admission, the patient was started on Ambien 10 mg  p.o. at bedtime p.r.n. sleep and Librium 25 mg p.o. b.i.d. on the day of  admission, then the Librium protocol to be started the next day.  On May 07, 2006, the patient was started on Geodon 10 mg IM x1 for emergency  sedation only, Protonix 40 mg p.o. daily, thiamin 100 mg p.o. daily, folic  acid 1 mg p.o. daily, nicotine patch 21 mg daily, Lexapro 10 mg daily.  He  was also started on Seroquel 25 mg p.o. q. 10 a.m. and 40 p.m. and Seroquel  50 mg p.o. at bedtime.  On May 08, 2006, the patient was started on  Neurontin 300 mg p.o. q.4 h while awake.  The Seroquel at night was changed  to 300 mg p.o. at bedtime.  On May 10, 2006 due to a slightly decreased  potassium, 40 mEq of K-Dur was given x1.  Another BMET was ordered but is  pending.   Upon admission, the patient was seen over the weekend by the admitting  doctor.  On May 09, 2006, the patient was seen by me.  Stated that he had  been taking pills and drinking the floor stripping solution from his house  for the past 2-3 days in order to commit suicide.  He states he cannot  remember this at all.  The solution contained alcohol as well as other  corrosives.  He does not remember the overdose and cannot explain why it  happened.  He seemed very confused as he discussed this on May 10, 2006.  The patient stated he did not think he took as many pills as stated.  I  guess I was just depressed when talking about his overdoses.  He states he  was  isolating himself.  He states he had a chemical imbalance but had just  quit taking his medications (Seroquel, antidepressant and a mood  stabilizer).  He denies suicidal ideation at this point.  He wants to get  back to work.  He states he talks with his parents a lot who live out of  town and he is close to them.  They came to visit him this weekend.  He  lives in the [address] in [place].   DISCHARGE DIAGNOSES:  AXIS I  Mood disorder not otherwise specified.  Anxiety disorder not otherwise specified.  History of polysubstance  dependence.  AXIS II  None.  AXIS III  Patient sustained injuries to his esophagus secondary to ingestion  of floor cleaner containing 2-butoxy ethanol, aluminum hydroxide as well as  another ingredient in addition to an overdose of Xanax.  AXIS IV  Moderate (lives in an [place], recent medical problems due to  his overdose).  AXIS V  Global Assessment of Functioning upon discharge was 30, GAF upon  admission 30.  GAF highest past year 55-60.   DISCHARGE PLAN:  There were no specific activity level or dietary  restrictions.   DISCHARGE MEDICATIONS:  1. Protonix 40 mg p.o. daily.  2. Lexapro 10 mg daily.  3. Seroquel 25 mg at 10 a.m. and 4 p.m.  4. Seroquel 300 mg p.o. at bedtime  5. Neurontin 300 mg p.o. q.4 h while awake.  6. Librium 25 mg p.o. q.4 h p.r.n. symptoms of withdrawal.   POST HOSPITAL CARE PLANS:  The patient is going to be transferred to [facility] inpatient unit.      Jasmine Pang, M.D.  Electronically Signed     BHS/MEDQ  D:  05/10/2006  T:  05/10/2006  Job:  [id number]

## 2011-01-29 NOTE — Discharge Summary (Signed)
   NAME:  [name], [name]                     ACCOUNT NO.:  [id number]   MEDICAL RECORD NO.:  [id number]                   PATIENT TYPE:  INP   LOCATION:  3039                                 FACILITY:  [facility]   PHYSICIAN:  Cristi Loron, M.D.            DATE OF BIRTH:  02/01/1956   DATE OF ADMISSION:  04/25/2003  DATE OF DISCHARGE:  04/26/2003                                 DISCHARGE SUMMARY   IDENTIFYING INFORMATION:  For full details of this admission, please refer  to typed History and Physical.   BRIEF HISTORY:  The patient is a 55 year old white male who suffers from  intractable back and leg pain.  He was worked up with a lumbar MRI which  demonstrated a L5 spondylolysis and L5-S1 spondylolisthesis with  degenerative disease.  As he has failed extensive medical management, I have  discussed the various treatment options with him including surgery.  The  patient has weighed the risks, benefits, and alternatives of surgery and has  decided to proceed with a lumbar decompression and fusion.   HOSPITAL COURSE:  I admitted the patient to [facility] on April 25, 2003.  On the day of admission, I performed a L5 Gill procedure and  fusion.  The surgery went well without complications (for full details of  this operation, please refer to typed operative note).   The postoperative course was remarkable only as follows.  The patient, by  report, was quite belligerent to the nursing staff on multiple occasions.  I  spoke to him on the morning of postoperative day #1.  He seemed to be doing  well but, at one point later on that day, he was threatening the nurses and  throwing things at them and he eventually went on to sign out AMA on April 26, 2003.   DISCHARGE INSTRUCTIONS:  The patient is given written discharge instructions  and instructed to follow up with me in four weeks.   FINAL DIAGNOSES:  1. L5-S1 spondylolisthesis.  2. Degenerative disease L5  spondylolysis.  3. Lumbago.  4. Lumbar radiculopathy.   PROCEDURES:  1. L5 Gill procedure.  2. L5-S1 posterior lumbar interbody fusion.  3. Insertion of bilateral L5-S1 tangent bone prosthesis.  4. Posterior nonsegmental instrumentation with Cotrel-Dubousset rods L5-S1.  5. L5-S1 posterolateral arthrodesis with local and morselized autograft bone     and Vitoss bone scaffolding.                                                Cristi Loron, M.D.    JDJ/MEDQ  D:  05/09/2003  T:  05/10/2003  Job:  [id number]

## 2011-01-29 NOTE — Discharge Summary (Signed)
NAMESTEPHANO, [name]           ACCOUNT NO.:  [id number]   MEDICAL RECORD NO.:  [id number]          PATIENT TYPE:  INP   LOCATION:  5704                         FACILITY:  [facility]   PHYSICIAN:  Corinna L. Lendell Caprice, MDDATE OF BIRTH:  02-11-1956   DATE OF ADMISSION:  05/01/2006  DATE OF DISCHARGE:  05/07/2006                                 DISCHARGE SUMMARY   DISCHARGE DIAGNOSIS:  1. Status post ingestion of floor cleaner containing 2-butoxyethanol,      ammonium hydroxide, and monoethanolamine as well as an overdose of      Xanax.  2. Respiratory failure secondary to above.  3. History of depression, panic, previous suicide attempts.  4. Tobacco abuse.  5. Chronic back pain.  6. Metabolic acidosis.   MEDICATIONS AT THE TIME OF DISCHARGE:  Protonix 40 mg daily, Thiamine 100 mg  daily, folic acid 1 mg daily, Ativan 1 mg p.o. q.4h. p.r.n. agitation or  anxiety, nicotine patch 21 mg daily, Lexapro 10 mg daily, Seroquel 25 mg  p.o. at 10 a.m. and 4 p.m., 50 mg p.o. q.h.s.   CONDITION:  Medically stable.   CONSULTATIONS:  Critical care medicae.  Psychiatry.  GI.   PROCEDURES:  1. Endotracheal intubation with mechanical ventilation, arterial line,      subclavian central line.  2. EGD showed no abnormalities.   DISCHARGE INSTRUCTIONS:  Diet regular.  Activity ad lib with one to one  suicide precautions currently.   LABORATORY DATA:  ABG on admission showed a pH 7.44, pCO2 13, pO2 135,  bicarbonate 9, base deficit 11, 99% saturation.  White blood cell count on  admission was 16,000, at discharge 7.  Hemoglobin 15, hematocrit 47,  platelet count normal.  Complete metabolic panel on admission showed a  sodium 142, potassium 3.1, chloride 108, bicarbonate 10, glucose 84, BUN 6,  creatinine 1.3, calcium 8.8, and normal liver function tests.  Serum  osmolarity was 309.  Lactic acid level was 15.  Cardiac enzymes negative.  Acetaminophen level less than 10.  Salicylate less than 4.   Urine drug  screen positive for benzodiazepines.  Blood alcohol less than 5.  Blood  cultures negative.  UA negative.  Bronchoalveolar lavage grew 35,000  colonies of yeast consistent with Candida.   SPECIAL STUDIES AND RADIOLOGY:  EKG showed normal sinus rhythm.  Chest x-ray  showed left basilar air space disease.  CT brain nothing acute.  Serial  chest x-rays were done which continued to show air space disease.   HISTORY AND HOSPITAL COURSE:  [name] is a 55 year old white male who has  a history of depression and previous suicide attempt.  He apparently called  EMS after having been sipping on floor stripping solution for the past  several days trying to commit suicide.  He also overdosed on  benzodiazepines.  The floor stripper apparently contains 2-butoxyethanol,  ammonium hydroxide, and monoethanolamine.  Poison Control was called.  The  patient was admitted to the intensive care unit and eventually required  intubation for airway protection.  Critical care medicine was consulted and  managed the patient while on the vent.  Due to his abnormal chest x-ray, he  was started on antibiotics which are, at this time, stopped.  He required a  bicarbonate drip.  EGD showed no esophageal or gastric erosions or other  abnormalities.  The patient eventually became more alert and was extubated.  He was transferred back to the hospitalists service.  He was seen by  psychiatry who felt that he had recurrent, severe major depressive disorder,  anxiety disorder, psychotic disorder.  They felt he was still at risk for  suicide and self-harm.  They felt that he had impaired judgment and  recommended inpatient psychiatric treatment.  At this time, the patient has  stable vital signs, is tolerating a regular diet, is ambulating, and is  medically stable for transfer.   Total time on the day of discharge is 45 minutes.      Corinna L. Lendell Caprice, MD  Electronically Signed     CLS/MEDQ  D:   05/07/2006  T:  05/07/2006  Job:  [id number]

## 2011-01-29 NOTE — Op Note (Signed)
NAME:  [name], PO                     ACCOUNT NO.:  [id number]   MEDICAL RECORD NO.:  [id number]                   PATIENT TYPE:  OIB   LOCATION:  5005                                 FACILITY:  [facility]   PHYSICIAN:  Cristi Loron, M.D.            DATE OF BIRTH:  12/31/1955   DATE OF PROCEDURE:  09/27/2002  DATE OF DISCHARGE:                                 OPERATIVE REPORT   PREOPERATIVE DIAGNOSES:  C3-4 and C4-5 degenerative disk disease,  spondylosis, stenosis, cervical radiculopathy, cervicalgia.   POSTOPERATIVE DIAGNOSES:  C3-4 and C4-5 degenerative disk disease,  spondylosis, stenosis, cervical radiculopathy, cervicalgia.   PROCEDURES:  1. C3-4 and C4-5 extensive anterior cervical diskectomy/decompression.  2. Interbody iliac crest allograft arthrodesis.  3. Anterior cervical plating, C3 to C5, with Synthes titanium plate and     screws.   SURGEON:  Cristi Loron, M.D.   ASSISTANT:  Clydene Fake, M.D.   ANESTHESIA:  General endotracheal.   ESTIMATED BLOOD LOSS:  150 cubic centimeters.   SPECIMENS:  None.   DRAINS:  None.   COMPLICATIONS:  None.   BRIEF HISTORY:  The patient is a 55 year old white male who suffers from  neck and right arm pain.  He has failed medical management and was worked up  with a cervical MRI, which demonstrated spondylosis and degenerative disk  disease at C3-4 and C4-5.  The patient's signs, symptoms, and physical exam  were consistent with a right C4 and C5 radiculopathy.  I discussed the  various treatment options with the patient, including surgery.  The patient  weighed the risks, benefits, and alternatives of surgery and  decided to proceed with a C3-4 and C4-5 anterior cervical diskectomy with  fusion and plating.   DESCRIPTION OF PROCEDURE:  The patient was brought to the operating room by  the anesthesia team.  General endotracheal anesthesia was induced.  The  patient remained in the supine position.  A  roll was placed underneath his  shoulders to place his neck in slight extension.  His anterior cervical  region was then prepared with Betadine scrub and Betadine solution.  Sterile  drapes were applied.  I then injected the area to be incised with Marcaine  with epinephrine solution.  I used a scalpel to make a transverse incision  in the patient's left anterior neck.  I used the Metzenbaum scissors to  dissect medial to the sternocleidomastoid muscle, jugular vein, and carotid  artery.  I bluntly dissected down to the anterior cervical spine, carefully  identifying the esophagus and retracting it medially.  We cleared the soft  tissue from the anterior cervical spine using the Kitner swabs.  We then  inserted a bent spinal needle into the upper exposed interspace and obtained  the intraoperative radiograph to confirm our location.  We then used  electrocautery to detach the medial border off the longus colli muscle  bilaterally from C3-4 and C4-5 intervertebral  disk space.  We inserted the  Caspar self-retaining retractor for exposure.   We began at C3-4.  We incised the C3-4 intervertebral disk with the 15 blade  scalpel and performed a partial diskectomy using the pituitary forceps.  We  inserted distraction screws at C3 and C4, distracted the interspace, and  then used the high-speed drill to decorticate the vertebral end plates at C3-  4, drill away the remainder of the C3-4 intervertebral disk, drilled away  some posterior spondylosis, and thinned out the posterior longitudinal  ligament.  The ligament was then incised with the arachnoid knife and then  removed with the Kerrison punch, undercutting the vertebral end plates at C3-  4, decompressing the thecal sac, and then we performed a foraminotomy about  the bilateral C4 nerve roots.   We repeated this procedure in a similar fashion at C4-5, i.e., incised the  disk, performed partial diskectomy with the pituitary forceps,  distracted  the interspace with distraction screws, and decorticated the vertebral end  plates at Z6-1, drilled away the C4-5 intervertebral disk, thinned out the  posterior longitudinal ligament, and then removed the posterior longitudinal  ligament with the Kerrison punch, undercutting the vertebral end plates at  W9-6, and then performed a foraminotomy about the bilateral C5 nerve roots.  I forgot to mention that decompression was done under magnification and  illumination of the microscope.   We now turned our attention to the arthrodesis.  We obtained the iliac crest  tricortical allograft bone graft and fashioned them to these approximate  dimensions:  8 mm in height and 1 cm in depth.  One bone graft was placed at  the distracted C4-5 interspace.  The distraction screw was removed from C5,  placed back in C3.  The C3-4 interspace was then distracted.  A second bone  graft was placed into C3-4.  The distraction screws were removed, and it was  noted that the bone grafts had good, snug fit at both levels.   We now turned our attention to the anterior spinal instrumentation.  We  drilled away some anterior spondylosis so that the plate would lie flat.  We  then laid the appropriate length Synthes titanium plate along the anterior  aspect of the vertebral bodies from C3 to C5, drilled two holes at each  vertebral body, tapped the holes, and then secured the plate to the  vertebral bodies by placing two 14 mm screws at C3, C4, and C5, and then  obtained the intraoperative radiograph that demonstrated good position of  the plate, screws, and interbody grafts from C3 to C5.  We then secured  these screws to the plate by using a locking screw at each screw.   We then obtained stringent hemostasis using bipolar electrocautery and  Gelfoam.  We copiously irrigated the wound out with bacitracin solution and removed the solution, then removed the Caspar self-retaining retractor,  inspected  the esophagus for any damage.  There was none apparent.  We then  reapproximated the patient's platysma muscle with interrupted 3-0 Vicryl  suture, the subcutaneous tissue using interrupted 3-0 Vicryl suture, and the  skin with Steri-Strips and Benzoin.  The wound was then coated with  bacitracin ointment, a sterile dressing was applied, the drapes were  removed, and the patient was subsequently extubated by the anesthesia team  and transported to the postanesthesia care unit in stable condition.  All  sponge, instrument, and needle counts were correct at the end of this case.  Cristi Loron, M.D.    JDJ/MEDQ  D:  09/27/2002  T:  09/28/2002  Job:  [id number]

## 2011-01-29 NOTE — H&P (Signed)
[facility]  Patient:    [name], [name] Visit Number: [id number] MRN: [id number]          Service Type: PSY Location: 40 [id number] Attending Physician:  Jasmine Pang Dictated by:   Young Berry Scott, N.P. Admit Date:  05/18/2001                     Psychiatric Admission Assessment  DATE OF ASSESSMENT:  May 19, 2001 at 10:05 a.m.  IDENTIFYING INFORMATION:  This is a 55 year old Caucasian male, who is a voluntary admission via the [facility] after having a "psychotic" episode.  The patient lives at [facility] and was found in the corner of his bathroom, shaky, swatting at things and unresponsive yesterday afternoon around lunchtime.  He was taken by police to the [facility] Emergency Room and initially had to be restrained by five people.  He was handcuffed and combative and with guttural speech.  The patient became lucid and cooperative after 20 mg of Haldol IM.  The patient has a long history of polysubstance abuse but reports that he has been clean and sober for the past two years on his current medications and he is quite proud of his record.  It is noted that, in the emergency room, his urine drug screen was negative.  The patient does report, today, that he does have a past history of seizures when withdrawing from medications.  He has been taking, as part of his regular medications, Klonopin 1.5 mg b.i.d. but admits that he has been taking more than he should, taking 5 mg of Klonopin q.d. instead of the 3 mg as prescribed.  The patient reports that he ran out of his Klonopin Monday, which was when he took his last dose, and he was unable to get a refill.  He did not call his physician or advise his physician of his situation.  As the week progressed, he continued to feel worse until he called in sick to work on the morning of September 5th because he felt shaky.  That is the last thing that he remembers  until he woke up in the emergency room.  The patient also reports that he has anxiety and that is why Dr. Nolen Mu had him on the Klonopin. Also reports that he has been treated previously for bipolar disorder by Dr. Nolen Mu.  The patient denies any symptoms of depression.  He has been eating well and sleeping well.  He denies any auditory or visual hallucinations.  Denies any suicidal or homicidal ideation.  PAST PSYCHIATRIC HISTORY:  The patient is followed by Charlies Silvers, M.D. in [place].  He has a history of prior inpatient admissions at [facility] approximately five years ago and at [facility] seven years ago.  Reports he has been clean and sober for the past two years on current medications and is proud of his record.  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  He has a previous history of polysubstance abuse.  Currently, he is working at a job everyday and is productive.  The patient is employed at [employer].  FAMILY HISTORY:  Not available.  ALCOHOL/DRUG HISTORY:  The patient has a past history of abuse of heroin, crack cocaine and benzodiazepines and ethyl alcohol and reports he has been clean and sober.  Urine drug screen was negative on admission and his ETOH  level was less than 5.  MEDICAL HISTORY:  The patient has no primary care Sidney Kann.  Medical problems: The patient denies any current medical problems.  He reports that he does have a past history of seizures when he has been coming off drugs and states he was treated for these in the past but is on nothing on any regular basis.  The patient also reports that he has diabetes which is controlled by diet and he checks his blood sugar maybe on an irregular basis maybe once a week.  Blood glucose, this a.m., was 100.  MEDICATIONS:  Klonopin 1.5 mg b.i.d., Zyprexa 10 mg b.i.d., Gabitril 8  mg b.i.d.  DRUG ALLERGIES:  No known drug allergies.  POSITIVE PHYSICAL FINDINGS:  The patient was seen in the emergency room and treated with 20 mg of Haldol and, at one point, it was reported that he did need to be restrained.  His urine drug screen is noted as negative.  He did have a CT scan without contrast, which was also negative.  His alcohol level was less than 5.  His glucose was 148 in the emergency room, 115 on admission here and was 100 as fasting this morning prior to breakfast.  In the emergency room, vital signs were temperature 100.3, pulse 115, respirations 26, blood pressure 132/79.  The patients SGOT is 77, SGPT is 30.  His chemistry panel was otherwise unremarkable.  His WBC is noted at 16.8 and we will repeat that in the morning.  Temperature was 98.3 this morning and he denies any medical problems or somatic symptoms of infection, although he does have some aching, muscle cramps and feels pretty banged up.  He has several minor contusions and no scrapes from yesterdays incident and a bruised right ear.  MENTAL STATUS EXAMINATION:  This is an alert, cooperative, white male, who is under the covers.  States that he feels miserable and is having some transient muscle cramps.  He is somewhat withdrawn.  His speech is normal, soft in tone, not spontaneous.  He is mildly irritable and quite anxious.  Thought process is logical and coherent.  No evidence of psychosis.  No dangerous ideations. Cognitively, he is intact.  DIAGNOSES: Axis I:    1. Rule out benzodiazepine withdrawal seizure.            2. 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            3. Bipolar disorder, stable.            4. History of polysubstance abuse, currently in remission. Axis II:   Deferred. Axis III:  1. Multiple superficial scrapes and contusions.            2. Leukocytosis not otherwise specified. Axis IV:   Deferred. Axis V:    Current 45; past year 31.  PLAN:  Voluntarily admit  the patient for evaluation of his psychotic episode and probable benzodiazepine withdrawal reaction.  We will have reviewed this  with Dr. Kathrynn Running and we will discontinue.  We will initially put him on a phenobarbital protocol with a loading dose last night and taper dose now. However, we will restart his Klonopin 1.5 mg p.o. b.i.d.  The patient has been quite stable and has been otherwise abstained from alcohol over the past two years on his current medication regimen and he is in agreement with the plan to place him back on the Klonopin and then talk with Dr. Nolen Mu about safety in establishing a  longer-term plan for his maintenance on these medications. We will validate his current medication dosages with Dr. Nolen Mu and continue to monitor him.  We will recheck his WBC tomorrow, however, he shows no signs of infection.  ESTIMATED LENGTH OF STAY:  Three to four days. Dictated by:   Young Berry Scott, N.P. Attending Physician:  Jasmine Pang DD:  05/19/01 TD:  05/19/01 Job: [id number]

## 2011-01-29 NOTE — Group Therapy Note (Signed)
CHIEF COMPLAINT:  Neck, arm and low back pain, left leg symptoms.   HISTORY OF PRESENT ILLNESS:  This is a 55 year old white male who states he  has had problems since essentially December of 2003 with his neck and low  back.  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  He states that the  problems became worse in December of 2003 and began to have pain in the neck  and shoulder region as well as into the back and leg.  He had two injections  by his account in the neck region without results.  Ultimately, he had a  cervical fusion at C3 to C5 performed by Dr. Lovell Sheehan in January of 2004, and  had lumbar fusion at L5 S1 in August for significant degenerative disk  disease as well as spondylosis and spondylolisthesis with stenosis.  The  patient has had minimal results from a pain relief standpoint since his  surgery.  He really finds nothing that improve his pain symptoms.  His pain  usually ranges from an 8/10 to a 10/10.  It worsens with walking, bending  and working.  He is working part-time as a Electrical engineer in a nearby  location where he can walk to/from home.   Patient denies any numbness or weakness in the arms or legs.  He does note  some paresthesias into the left ribs and left medial arm.  Left shoulder has  radiating type pain as well.  The low back area seems to bother him more so  in the left region with pain radiating down the posterolateral leg more so  into the buttock area than anywhere else.  He has used Percocet 10s for pain  relief and used up to six of these a day.  He is also taking Xanax for  anxiety disorder, 6-8 tablets a day.  I do not have the doses.   PAST MEDICAL HISTORY:  Significant for diabetes and panic attacks.  The  patient is really a poor historian and it was hard to glean information from  him.   CURRENT MEDICATIONS INCLUDE:  1. Percocet 10.  2. Xanax p.r.n. (0.5 mg  ?).   SOCIAL HISTORY:  Patient smokes.  He lives alone and is single.  Denies  drinking.   FAMILY HISTORY:  Noncontributory, although he does report a history of  diabetes.   REVIEW OF SYSTEMS:  Patient denies any chest pain or shortness of breath.  No cold symptoms.  He has occasional wheezing and coughing related to his  smoking.  He denies weakness, numbness, dizziness, spasms, vertigo or  confusion.  He does have anxiety with depression.  Sleep is inhibited.  He  denies nausea, vomiting, reflux, diarrhea, constipation.  He did note some  occasional bowel incontinence.   VOCATIONAL HISTORY:  Patient worked as a Estate agent until July of  2004.  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  He needs frequent rest breaks.   PHYSICAL EXAMINATION:  Blood pressure 106/61, pulse of 97, respiratory rate  16, sating 99% on room air.  Patient is alert and walks with fair gait but  with stiff posture.  In the neck region the patient positive Spurling's test  to the left.  He had intact shoulder, arm and wrist strength today at 5/5.  Reflexes were all diminished at 1+ on  either upper extremity.  Sensory exam  was grossly intact.  He has a positive impingement sign today and tenderness  on palpation of the infraspinatus tendon as well as the latissimus dorsi as  it attached on the humerus.  He had multiple areas of tenderness in the  trapezius region as well as sternocleidomastoid, cervical and cervical  paraspinals, left greater than right.  He had poor range of motion  throughout the neck today in all planes.  Extension seemed to bother him a  little more than flexion today.  In lower extremities he had 5/5 strength.  Reflexes were 2+.  Straight leg testing was negative.  He did have some pain  with palpation of the left greater trochanter, left piriformis region.  He  had a positive Patrick's test on the left and negative on the right.  The  PSIS area  was much more tender on the left than the right.  There were no  obvious pelvic asymmetries noted.  He had some scar tissue and firmness  noted in the operative sites with some muscle spasm more notable on the  right than the left.  His back range of motion was approximately 30 degrees  of flexion, 10 degrees extension and 10-15 degrees lateral with bending and  rotation either side, all producing similar amounts of pain.  No leg length  discrepancy was noted.  Skin was intact.  Sensory exam was grossly normal.   ASSESSMENT:  1. Status post cervical and lumbar fusions.  Patient has components of post     laminectomy syndrome.  2. Possible rotator cuff syndrome on the left involving the posterior     elements.  3. Sacroiliac joint inflammation on the left.  4. Anxiety and depression.  5. Cervical radiculopathy perhaps involving C6 and C7.   PLAN:  1. For neuropathic pain, will initiate a trial of Neurontin 300 mg q.h.s.     initially and then moving up to 300 mg t.i.d. over 10 days' time.  2. After informed consent, we injected the left rotator cuff via the     inferior approach, veering around the infraspinatus and the latissimus     dorsi tendons with 3 cc of 1% lidocaine and 40 mg of Kenalog.  The     patient tolerated this well.  He is asked to apply ice to the area as     tolerated.  3. I would like to set the patient up for a left SI joint injection, but I     do not know if he has the monetary means to have this done at this point.  4. I discussed about applying for disability.  The patient told me he was     already turned down and I would be rather surprised that he would be     turned down considering his current state.  5. The patient would benefit from some therapies but he claims he has no     transportation for these at this point, so we hold off on these for now. 6. I gave him oxycodone 5 mg one to two q.6 hours p.r.n. for pain.  The goal     would be to titrate these  down as tolerated.  7. Will see the patient back in approximately one month's time.     Ranelle Oyster, M.D.   ZTS/MedQ  D:  11/13/2003 10:53:40  T:  11/13/2003 12:08:05  Job #:  [id number]   cc:   Tinnie Gens  Roselyn Meier, M.D.  [address]  [place]  [place] [address]  Fax: [phone]

## 2011-01-29 NOTE — H&P (Signed)
[facility]  Patient:    [name], [name] Visit Number: [id number] MRN: [id number]          Service Type: PSY Location: 50 [id number] Attending Physician:  Jeanice Lim Dictated by:   Reymundo Poll Dub Mikes, M.D. Admit Date:  06/19/2001                     Psychiatric Admission Assessment  DATE OF ADMISSION:  June 19, 2001  CHIEF COMPLAINT:  This is the second admission to [facility] for this 55 year old male that was admitted after he increased his use of Klonopin and started abusing it again.  HISTORY OF PRESENT ILLNESS:  He was discharged from the inpatient unit on Klonopin 2 mg twice a day, gradually increased up to 3 and 4 mg plus more twice a day.  Sleeping a lot.  He works, gets home, uses Klonopin.  He claims that he takes Zyprexa and Neurontin for his racing thoughts and that is better.  Denies any depression.  Does admit to panic attacks that he controls with the Klonopin.  Last time before he came into this unit the first time, he had a seizure when he tried to stop Klonopin.  So he ran out this time and his family physician did not want to refill the medication and he was sent here for detoxification.  PAST PSYCHIATRIC HISTORY:  Sees Charlies Silvers, M.D., for the last two years.  Has been in [facility], came clean from alcohol and drugs.  Five years clean for alcohol, two years clean from other drugs but has been dependent on benzodiazepines.  SUBSTANCE ABUSE HISTORY:  Cocaine, heroin, Xanax up to two years ago.  Alcohol up to four years ago.  PAST MEDICAL HISTORY:  Denies history of any multiple medical conditions.  MEDICATIONS: 1. Neurontin 800 mg three times a day. 2. Zyprexa 15 mg twice a day. 3. Klonopin supposed to take 2 mg twice a day.  Claims that the SSRI has made him more anxious.  DRUG ALLERGIES:  Denies any drug allergies.  PHYSICAL EXAMINATION:  GENERAL:  Will be performed by our nurse  practitioner.  SOCIAL HISTORY:  Works in [employer] full-time for the last eight years, stable.  Lives in the [place].  He is divorced, no children. He was in [facility] for a while.  FAMILY HISTORY:  Denies history of any major medical or psychiatric conditions.  MENTAL STATUS EXAMINATION:  Well-nourished, well-developed, alert, cooperative male, some psychomotor retardation, slow production.  Mood: Depression and anxiety.  Affect: Depression and anxiety.  Thought process: Logical, coherent, and relevant, yet not spontaneous content.  No suicidal ideation, states that he just wants to get himself off the Klonopin and start to work on himself. Cognition: Intact to person, place, and time.  Recent memory and concentration are impaired at this time.  ADMISSION DIAGNOSES: Axis I:    1. Benzodiazepine dependence.            2. Panic attack disorder.            3. Rule out bipolar disorder. Axis II:   Deferred. Axis III:  No diagnosis. Axis IV:   Moderate. Axis V:    Global assessment of functioning upon admission 30-35, highest            global assessment of functioning in the last year 65.  INITIAL PLAN OF CARE:  We are going to detoxify using phenobarbital.  We are going to keep him on the Neurontin and the Zyprexa.  We are going to start Lexapro to help with the panic disorder.  Once stabilized and detoxified, we are going to refer back to Dr. Nolen Mu. Dictated by:   Reymundo Poll Dub Mikes, M.D. Attending Physician:  Jeanice Lim DD:  06/20/01 TD:  06/20/01 Job: [id number]

## 2011-01-29 NOTE — Consult Note (Signed)
NAMENORVIL, MARTENSEN NO.:  [id number]   MEDICAL RECORD NO.:  [id number]          PATIENT TYPE:  INP   LOCATION:  5704                         FACILITY:  [facility]   PHYSICIAN:  Antonietta Breach, M.D.  DATE OF BIRTH:  1955/10/23   DATE OF CONSULTATION:  05/02/2006  DATE OF DISCHARGE:                                   CONSULTATION   REFERRING PHYSICIAN:  Jackie Plum, M.D.   REASON FOR CONSULTATION:  Overdose with caustic liquid.   Mr. Anh Mangano is a 55 year old male admitted to the [facility] on May 01, 2006 after an intentional overdose, drinking floor  cleaner.   Mr. Holloran drank the fluid over a two-to-three day period.  He is known to  have a history of suicide attempts.  He also overdosed with benzodiazepines.   He has a history of abusing several substances, including benzodiazepines  and alcohol.  It is unclear as to what extent these have been involved  acutely.   The patient has acutely been intubated due to possible aspiration of the  floor cleaner.   PAST PSYCHIATRIC HISTORY:  Mr. Parekh cannot provide any additional history  at this time.  It is known that he has a history of suicide attempts and  previous depression.   There is no known history of mania, however, definitive history cannot be  obtained at this.   There was a psychiatric admission to [facility] in 2002.  There is a history of an overdose on benzodiazepines in 2005.   His past psychotropic history includes Zyprexa and Neurontin.  He has been  followed by Dr. Andee Poles in the past.   SUBSTANCE ABUSE HISTORY:  The patient has undergone rehabilitation at  alcohol-drug services.  He also has been in the [employer].  He has a  history of using heroin and cocaine.  He has also abuse benzodiazepines.  He  has a history of withdrawal seizures with benzodiazepine withdrawal.   FAMILY PSYCHIATRIC HISTORY:  None known.   SOCIAL  HISTORY:  The patient is divorced.  He works at [employer].  He  has no children.  He has operated a Chief Executive Officer.  He has substance abuse  history as above.   GENERAL MEDICAL PROBLEMS:  Status post overdose with benzodiazepines and  corrosive cleaner, chronic back pain.   SURGICAL HISTORY:  History of laminectomy.   MEDICATIONS:  The MAR is reviewed. Psychotropic include Ativan 1 to 4 mg q.  1 hour p.r.n.   ALLERGIES:  THE PATIENT HAS NO KNOWN DRUG ALLERGIES.   LABORATORY DATA:  Hemoglobin is elevated at 18, the white blood cell count  is also elevated at 16.5.  Liver function tests are within normal limits.  The Tylenol level was negative.  Aspirin negative.  Urine drug screen was  positive for benzodiazepines.  Blood alcohol level negative.  Head CT is  unremarkable.  Chest x-ray shows a left basilar patchy area, which is  suspicious of aspiration.   REVIEW OF SYSTEMS:  CONSTITUTIONAL:  Afebrile.  HEAD:  No known trauma.  The  patient is  currently intubated, so the review of systems is limited and  derived from the chart.  The systems of cardiac, respiratory, neurological  psychiatric, gastrointestinal, genitourinary, skin, endocrine, metabolic,  musculoskeletal, and hematologic/lymphatic are reviewed by chart without any  contributions.   PHYSICAL EXAMINATION:  VITAL SIGNS:  Temperature 98.3, pulse 80,  respirations 22, blood pressure 112/58.  MENTAL STATUS EXAM:  Not possible at this time due to the patient's  intubation.   ASSESSMENT:  AXIS I:  1. 293.83.  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  2. Rule out major depressive disorder, recurrent with psychotic features.  3. Anxiety disorder, not otherwise specified.  293.84.  4. Polysubstance dependence.  AXIS II:  Deferred.  AXIS III:  See general medical problems.  AXIS IV:  Unknown.  AXIS V:  Estimated at 30.   RECOMMENDATIONS:  1. Would monitor for withdrawal  and be ready to start the Ativan      withdrawal protocol or phenobarbital withdrawal protocol if needed.  2. Would utilize Haldol 3 mg q.4 hours p.r.n. severe agitation,      combativeness, only if Ativan not effective.  3. Low stimulation ego-supportive therapy.  4. Psychiatry will follow up once the patient is extubated for further      evaluation and recommendations.      Antonietta Breach, M.D.  Electronically Signed     JW/MEDQ  D:  05/06/2006  T:  05/07/2006  Job:  [id number]

## 2011-01-29 NOTE — Discharge Summary (Signed)
[name], TESCHNER           ACCOUNT NO.:  [id number]   MEDICAL RECORD NO.:  [id number]          PATIENT TYPE:  INP   LOCATION:  5704                         FACILITY:  [facility]   PHYSICIAN:  Corinna L. Lendell Caprice, MDDATE OF BIRTH:  09/25/1955   DATE OF ADMISSION:  05/01/2006  DATE OF DISCHARGE:                                 DISCHARGE SUMMARY   Audio too short to transcribe (less than 5 seconds)      Corinna L. Lendell Caprice, MD     CLS/MEDQ  D:  05/07/2006  T:  05/07/2006  Job:  [id number]

## 2011-01-29 NOTE — Consult Note (Signed)
[name], [name] NO.:  [id number]   MEDICAL RECORD NO.:  [id number]          PATIENT TYPE:  INP   LOCATION:  5704                         FACILITY:  [facility]   PHYSICIAN:  [name], M.D.  DATE OF BIRTH:  27-Mar-1956   DATE OF CONSULTATION:  05/06/2006  DATE OF DISCHARGE:                                   CONSULTATION   [name] continues to have much feeling on edge, decreased concentration,  anhedonia, depressed mood, intermittent sobbing, and decreased energy.  He  continues to have suicidal thoughts due to his misery.   Vital signs:  Temperature 98.2, pulse 80, respirations 18, blood pressure  109/68, O2 saturation on room air is 99%.   The patient started rocking back and forth today due to his anxiety and he  was placed on Ativan 1 mg q.4 hours p.r.n.  He has received, today, 4 mg of  Ativan to calm his nerves.  He has no signs of alcohol withdrawal and the  patient denies having had any alcohol in 2 years.  He does acknowledge a  very extensive use of alcohol prior to that time.  He does not use any  illegal drugs.   He has been admitted multiple times to a psychiatric hospital.  He has a  history of prior suicide attempts and prior severe depression.  He appears  to be responding to internal stimuli, although he denies that.   Mental status exam:  As above.  Thought content:  He appears to be  responding to internal stimuli, although he denies it.  He does have thought  blocking.  He is oriented to all spheres.  His affect is anxious mixed with  sobbing.  Concentration is decreased.  Judgment is impaired for self care,  although his insight is intact for need to treat his nerves.   ASSESSMENT:  AXIS I:  1. 293.83.  Mood disorder, not otherwise specified, depressed.  2. Major depressive disorder, recurrent, severe.  3. Anxiety disorder, not otherwise specified.  4. Psychotic disorder, not otherwise specified.   RECOMMENDATIONS:  1. Would  continue the Ativan p.r.n. regimen for anxiety.  2. Would start Seroquel 25 mg b.i.d. and 50 mg q.h.s. with 50 mg q.h.s.      p.r.n. insomnia.  3. Would start Lexapro 10 mg p.o. q.a.m. for anti-depression and anxiety.  4. Would continue the sitter for suicide prevention.  5. When medically cleared, would admit to a psychiatric hospital for      further evaluation and treatment for psychotic depression.      [name], M.D.  Electronically Signed    JW/MEDQ  D:  05/06/2006  T:  05/07/2006  Job:  [id number]

## 2011-01-29 NOTE — H&P (Signed)
NAMEJONTAY, [name] NO.:  [id number]   MEDICAL RECORD NO.:  [id number]          PATIENT TYPE:  EMS   LOCATION:  MAJO                         FACILITY:  [facility]   PHYSICIAN:  Lucita Ferrara, MD         DATE OF BIRTH:  1956/09/12   DATE OF ADMISSION:  05/01/2006  DATE OF DISCHARGE:                                HISTORY & PHYSICAL   REASON FOR ADMISSION:  Suicide attempt with overdose with floor cleaner and  corrosives.   HISTORY OF PRESENT ILLNESS:  The patient is a 55 year old male with past  medical history significant for depression and suicide attempts, presents  with complaints of overdose.  History was provided by the patient.  States  he has been drinking the floor stripping solution from his house in the last  2-3 days in order to commit suicide.  The floor stripping is Museum/gallery conservator type, contains alcohol and corrosives.  Specifically, the floor  stripping contains two butoxyethanol.  In addition to that, it contains  monoethanolamine.  In addition, it contains ammonium hydroxide and water.  The patient presented to the emergency room stuporous.  [facility] was called and a workup was discussed.  Apparently, the patient was  found by the paramedics.  He was lethargic.  In addition to taking the  corrosive, the patient also states that he took Xanax and Alprazolam.  Of  note, the patient actually has had suicide attempts with benzodiazepines in  the past.  Upon arrival to the emergency room, the patient is in no  respiratory distress.   PAST MEDICAL HISTORY:  1. Back pain.  2. Status post fusions of his disk, laminectomies and cervical level.  3. History of panic disorder.  4. Depression.  5. Significant history of suicide attempts, several times.   PAST SURGICAL HISTORY:  Back surgery.   SOCIAL HISTORY:  The patient smokes two packs per day for the last 32 years.  He denies alcohol or drugs, currently unemployed, and he used to be  a  Museum/gallery exhibitions officer.   FAMILY HISTORY:  Noncontributory.   ALLERGIES:  No known drug allergies.   MEDICATIONS:  Apparently, the patient is actually taking Alprazolam at home,  or he has a prescription for it.   REVIEW OF SYSTEMS:  The patient has no complaints of chest pain or shortness  of breath.   PHYSICAL EXAMINATION:  GENERAL APPEARANCE:  The patient is in no acute  respiratory distress.  He is confused, very stuporous, but responds to pain.  He is alert and oriented x3, surprisingly.  He looks very intoxicated.  VITAL SIGNS:  Blood pressure 113/33, pulse rate 89, respirations 40.  On  repeat, blood pressure 94/34, pulse rate 70, respirations 28.  HEENT:  Normocephalic, atraumatic.  Sclerae anicteric.  Mouth:  Mucous  membranes moist.  He has no bleeding in his mouth.  There are no signs of  regurgitation or vomiting.  NECK:  Supple.  No thyromegaly.  No JVD.  CARDIAC:  S1, S2.  Regular rate and rhythm, no murmurs, rubs, clicks.  ABDOMEN:  Soft,  nontender, positive bowel sounds.  EXTREMITIES:  No cyanosis, clubbing or edema.  NEUROLOGICAL:  Patient alert and oriented x3.  Cranial nerves II-XII grossly  intact.  Sensation is grossly intact.  Reflexes grossly intact.   LABORATORY DATA:  Tylenol level was less than 10.  AST, ALT were normal.  AST 25, ALT 20, alk-phos 84, bilirubin 0.9.  White count 16.5, hemoglobin  15.2, hematocrit 47.4, platelets 211,000.  Urine drug screen was negative  for opiates, negative for cocaine, positive for benzodiazepines, negative  for amphetamines, negative for barbiturates, negative for THC, negative for  tricyclic antidepressants.  Of note, alcohol level was less than 5.  Salicylate level was less than 4, which was negative.  His basic metabolic  panel on his ABG's were consistent with a gap metabolic acidosis.  His  sodium was 142, potassium 3.2, chloride 119, BUN 5, glucose 85, pH 7.4, PCO2  12.4, bicarbonate 8.5.  On repeat, his pH is 7.4,  PCO2 13.1, PO2 135,  bicarbonate 9.1.  Oxygen saturation was 99% on 1 liter of O2 nasal cannula.  Acid based deficit is 11.  On repeat again, his sodium was 142, potassium  3.1, chloride 108, CO2 is now 10.  BUN 6, creatinine 1.3, phosphorus 2.7,  calcium 8.8, albumin 4.1.  Chest x-ray showed a left lower lobe infiltrate,  and there was some thought that this may be due to aspiration-type of  pneumonia which is actually not consistent as most aspirations present in  the right lower lobe or right middle lobe area.  EKG showed left atrial  enlargement and prolonged QT.   ASSESSMENT/PLAN:  1. Intoxication.  The patient is a 55 year old male with acute      intoxication of corrosive material that contained both alcohol or      butoxyethanol and ammonium hydroxide.  Upon ER consultation with [facility], they suggested that the patient should be monitored.      Given his gap acidosis, we will go ahead and admit him to the intensive      care unit for monitoring.  Will continue aggressive IV hydration.  We      will go ahead and get a lactic acid level and serum osmol.  In      addition, we will get ethylene glycol level.  We will repeat EKG and      UA.  Of note, CT of the head was negative for intracranial hemorrhage.      Will monitor electrolytes, mag phos and coags.  We will monitor for      change in mental status.  2. Suicide attempt.  History of suicide attempt in the past.  History of      depression and panic disorder.  We will go ahead and contact      Psychiatry.  They will be called tonight for possible inpatient      management.      Lucita Ferrara, MD  Electronically Signed     RR/MEDQ  D:  05/02/2006  T:  05/02/2006  Job:  [id number]

## 2011-06-24 LAB — RAPID URINE DRUG SCREEN, HOSP PERFORMED
Barbiturates: NOT DETECTED
Benzodiazepines: POSITIVE — AB
Cocaine: NOT DETECTED
Opiates: POSITIVE — AB

## 2011-06-24 LAB — COMPREHENSIVE METABOLIC PANEL
ALT: 17
AST: 25
Albumin: 3.6
Alkaline Phosphatase: 98
CO2: 27
Chloride: 103
Creatinine, Ser: 1.13
GFR calc Af Amer: >60
GFR calc non Af Amer: >60
Potassium: 4.5
Sodium: 140
Total Bilirubin: 0.8

## 2011-06-24 LAB — CBC
HCT: 38.3 — ABNORMAL LOW
Hemoglobin: 12.9 — ABNORMAL LOW
MCHC: 33.8
MCHC: 34.1
MCV: 89.3
Platelets: 166
Platelets: 200
RBC: 4.27
RBC: 4.92
RDW: 13.5
RDW: 13.6
WBC: 15.6 — ABNORMAL HIGH

## 2011-06-24 LAB — POCT CARDIAC MARKERS
CKMB, poc: 8
Myoglobin, poc: 261
Operator id: 4661

## 2011-06-24 LAB — DIFFERENTIAL
Basophils Absolute: 0.1
Basophils Absolute: 0.1
Basophils Absolute: 0.2 — ABNORMAL HIGH
Basophils Relative: 1
Eosinophils Absolute: 0
Eosinophils Absolute: 0
Eosinophils Relative: 0
Eosinophils Relative: 0
Eosinophils Relative: 0
Lymphocytes Relative: 11 — ABNORMAL LOW
Lymphocytes Relative: 6 — ABNORMAL LOW
Lymphs Abs: 1.1
Lymphs Abs: 1.6
Monocytes Absolute: 0.6
Neutro Abs: 8.5 — ABNORMAL HIGH
Neutrophils Relative %: 73

## 2011-06-24 LAB — BASIC METABOLIC PANEL
BUN: 4 — ABNORMAL LOW
CO2: 25
CO2: 27
CO2: 29
Calcium: 8.7
Calcium: 9
Chloride: 110
Creatinine, Ser: 0.79
GFR calc Af Amer: >60
GFR calc Af Amer: >60
GFR calc Af Amer: >60
Glucose, Bld: 134 — ABNORMAL HIGH
Glucose, Bld: 154 — ABNORMAL HIGH
Potassium: 3.5
Potassium: 4
Sodium: 142
Sodium: 145

## 2011-06-24 LAB — HEMOGLOBIN A1C
Hgb A1c MFr Bld: 6
Mean Plasma Glucose: 136

## 2011-06-24 LAB — BLOOD GAS, ARTERIAL
Acid-Base Excess: 0.8
Bicarbonate: 26.5 — ABNORMAL HIGH
FIO2: 0.36
TCO2: 23.4
pCO2 arterial: 52.2 — ABNORMAL HIGH
pO2, Arterial: 68.2 — ABNORMAL LOW

## 2011-06-24 LAB — URINALYSIS, ROUTINE W REFLEX MICROSCOPIC
Bilirubin Urine: NEGATIVE
Glucose, UA: NEGATIVE
Nitrite: NEGATIVE
Protein, ur: NEGATIVE
Urobilinogen, UA: 1
pH: 5.5

## 2011-06-24 LAB — T4, FREE: Free T4: 1.05

## 2011-06-24 LAB — URINE CULTURE

## 2011-06-24 LAB — CK TOTAL AND CKMB (NOT AT ARMC)
CK, MB: 16.8 — ABNORMAL HIGH
Relative Index: 0.7

## 2011-06-24 LAB — TROPONIN I
Troponin I: 0.04
Troponin I: 0.04

## 2011-06-24 LAB — CARDIAC PANEL(CRET KIN+CKTOT+MB+TROPI)
CK, MB: 11.4 — ABNORMAL HIGH
Total CK: 2047 — ABNORMAL HIGH
Troponin I: 0.04

## 2011-06-24 LAB — B-NATRIURETIC PEPTIDE (CONVERTED LAB): Pro B Natriuretic peptide (BNP): 172 — ABNORMAL HIGH

## 2011-06-24 LAB — ETHANOL: Alcohol, Ethyl (B): <5

## 2011-06-24 LAB — WOUND CULTURE

## 2011-06-24 LAB — TSH
TSH: 0.277 — ABNORMAL LOW
TSH: 0.319 — ABNORMAL LOW

## 2011-06-24 LAB — ACETAMINOPHEN LEVEL: Acetaminophen (Tylenol), Serum: <10 — ABNORMAL LOW

## 2011-06-24 LAB — MAGNESIUM: Magnesium: 2

## 2011-06-24 LAB — CK: Total CK: 1578 — ABNORMAL HIGH

## 2011-06-24 LAB — PHOSPHORUS: Phosphorus: 2.7

## 2011-06-24 LAB — SALICYLATE LEVEL: Salicylate Lvl: <4

## 2011-06-24 LAB — T3: T3, Total: 96

## 2012-09-30 ENCOUNTER — Encounter (HOSPITAL_BASED_OUTPATIENT_CLINIC_OR_DEPARTMENT_OTHER): Payer: Self-pay | Admitting: *Deleted

## 2012-09-30 ENCOUNTER — Emergency Department (HOSPITAL_BASED_OUTPATIENT_CLINIC_OR_DEPARTMENT_OTHER)
Admission: EM | Admit: 2012-09-30 | Discharge: 2012-09-30 | Disposition: A | Discharge status: Home / Self Care | Payer: Self-pay | Attending: Emergency Medicine | Admitting: Emergency Medicine

## 2012-09-30 DIAGNOSIS — Z9889 Other specified postprocedural states: Secondary | ICD-10-CM | POA: Insufficient documentation

## 2012-09-30 DIAGNOSIS — Z8739 Personal history of other diseases of the musculoskeletal system and connective tissue: Secondary | ICD-10-CM | POA: Insufficient documentation

## 2012-09-30 DIAGNOSIS — M2550 Pain in unspecified joint: Secondary | ICD-10-CM | POA: Insufficient documentation

## 2012-09-30 DIAGNOSIS — Z794 Long term (current) use of insulin: Secondary | ICD-10-CM | POA: Insufficient documentation

## 2012-09-30 DIAGNOSIS — M542 Cervicalgia: Secondary | ICD-10-CM | POA: Insufficient documentation

## 2012-09-30 DIAGNOSIS — J4489 Other specified chronic obstructive pulmonary disease: Secondary | ICD-10-CM | POA: Insufficient documentation

## 2012-09-30 DIAGNOSIS — IMO0001 Reserved for inherently not codable concepts without codable children: Secondary | ICD-10-CM | POA: Insufficient documentation

## 2012-09-30 DIAGNOSIS — E119 Type 2 diabetes mellitus without complications: Secondary | ICD-10-CM | POA: Insufficient documentation

## 2012-09-30 DIAGNOSIS — Z79899 Other long term (current) drug therapy: Secondary | ICD-10-CM | POA: Insufficient documentation

## 2012-09-30 DIAGNOSIS — J449 Chronic obstructive pulmonary disease, unspecified: Secondary | ICD-10-CM | POA: Insufficient documentation

## 2012-09-30 DIAGNOSIS — F17200 Nicotine dependence, unspecified, uncomplicated: Secondary | ICD-10-CM | POA: Insufficient documentation

## 2012-09-30 HISTORY — DX: Type 2 diabetes mellitus without complications: E11.9

## 2012-09-30 HISTORY — DX: Chronic obstructive pulmonary disease, unspecified: J44.9

## 2012-09-30 HISTORY — DX: Other cervical disc degeneration, unspecified cervical region: M50.30

## 2012-09-30 MED ORDER — KETOROLAC TROMETHAMINE 60 MG/2ML IM SOLN
60.0000 mg | Freq: Once | INTRAMUSCULAR | Status: AC
  Administered 2012-09-30: 60 mg via INTRAMUSCULAR
  Filled 2012-09-30: qty 2

## 2012-09-30 MED ORDER — MELOXICAM 7.5 MG PO TABS: 7.5000 mg | ORAL_TABLET | Freq: Every day | ORAL | Status: DC

## 2012-09-30 MED ORDER — PREDNISONE 20 MG PO TABS: ORAL_TABLET | ORAL | Status: DC

## 2012-09-30 NOTE — ED Notes (Signed)
Pt states he has a hx of DDD since 2005, but tonight neck is hurting worse. Also wants left eye checked. It is red and swollen (lid)

## 2012-09-30 NOTE — ED Provider Notes (Signed)
History     CSN: [id number]  Arrival date & time 09/30/12  1819   First MD Initiated Contact with Patient 09/30/12 2148      Chief Complaint  Patient presents with  . Neck Pain    (Consider location/radiation/quality/duration/timing/severity/associated sxs/prior treatment) HPI Comments: Patient with a history of degenerative disk disease and spine surgery (fusion) presents with neck pain.  The patient had has pain in his neck since 2005 but his pain has gotten worse over the past 2 weeks.  Denies recent trauma, accident, or fall.  He is in rehab for narcotic abuse.  He has tried to take ibuprofen for his pain but it has not helped.  The patient reports decrease range of motion.  No recent fevers or infections.  No N/V/D.  He denies any radicular symptoms.  Onset was gradual, nothing has helped his symptoms.  Movement makes pain worse.   Patient is a 57 y.o. male presenting with neck pain. The history is provided by the patient.  Neck Pain  Pertinent negatives include no numbness, no headaches and no weakness.    Past Medical History  Diagnosis Date  . DDD (degenerative disc disease), cervical   . Diabetes mellitus without complication   . COPD (chronic obstructive pulmonary disease)     Past Surgical History  Procedure Date  . Back surgery   . Neck surgery     History reviewed. No pertinent family history.  History  Substance Use Topics  . Smoking status: Current Every Day Smoker  . Smokeless tobacco: Not on file  . Alcohol Use: No      Review of Systems  Constitutional: Negative for fever, chills and fatigue.  HENT: Positive for neck pain and neck stiffness.   Musculoskeletal: Positive for myalgias and arthralgias. Negative for back pain and gait problem.  Neurological: Negative for dizziness, weakness, light-headedness, numbness and headaches.    Allergies  Review of patient's allergies indicates no known allergies.  Home Medications   Current Outpatient Rx   Name  Route  Sig  Dispense  Refill  . CITALOPRAM HYDROBROMIDE 40 MG PO TABS   Oral   Take 40 mg by mouth daily.         Marland Kitchen HYDROXYZINE HCL 50 MG PO TABS   Oral   Take 50 mg by mouth 3 (three) times daily as needed.         . INSULIN ISOPHANE & REGULAR (70-30) 100 UNIT/ML [place] SUSP   Subcutaneous   Inject into the skin.           BP 115/67  Pulse 59  Temp 98.3 F (36.8 C) (Oral)  Resp 16  Ht 5\' 8"  (1.727 m)  Wt 195 lb (88.451 kg)  BMI 29.65 kg/m2  SpO2 97%  Physical Exam  Nursing note and vitals reviewed. Constitutional: He appears well-developed and well-nourished.  HENT:  Head: Normocephalic and atraumatic.  Eyes: Conjunctivae normal are normal.  Neck: Normal range of motion. Neck supple.       Healed scar anterior R neck  Abdominal: Soft. There is no tenderness. There is no CVA tenderness.  Musculoskeletal: He exhibits tenderness.       Cervical back: He exhibits tenderness. He exhibits normal range of motion, no bony tenderness, no swelling and no edema.       Thoracic back: Normal.       Lumbar back: Normal.       Negative Spurling's bilaterally.   Neurological: He is alert. He has  normal reflexes. No sensory deficit. He exhibits normal muscle tone.       5/5 strength in entire lower extremities bilaterally. No sensation deficit.   Skin: Skin is warm and dry.  Psychiatric: He has a normal mood and affect.    ED Course  Procedures (including critical care time)  Labs Reviewed - No data to display No results found.   1. Neck pain     Patient seen and examined. Medications ordered.   Vital signs reviewed and are as follows: Filed Vitals:   09/30/12 1859  BP: 115/67  Pulse: 59  Temp: 98.3 F (36.8 C)  Resp: 16   Toradol given. Patient d/c to home on steroid taper and NSAIDs. Urged neurosurg f/u.   MDM  Chronic neck pain. No extremity symptoms. Treat conservatively. Do not suspect vascular etiology. Full ROM in neck.         Renne Crigler, [place] 10/01/12 1254

## 2012-09-30 NOTE — ED Notes (Signed)
[facility] called for transport 

## 2012-10-02 NOTE — ED Provider Notes (Signed)
Medical screening examination/treatment/procedure(s) were performed by non-physician practitioner and as supervising physician I was immediately available for consultation/collaboration.    Anelise Staron R Tonyetta Berko, MD 10/02/12 0805 

## 2012-10-16 ENCOUNTER — Encounter (HOSPITAL_BASED_OUTPATIENT_CLINIC_OR_DEPARTMENT_OTHER): Payer: Self-pay | Admitting: *Deleted

## 2012-10-16 ENCOUNTER — Emergency Department (HOSPITAL_BASED_OUTPATIENT_CLINIC_OR_DEPARTMENT_OTHER)
Admission: EM | Admit: 2012-10-16 | Discharge: 2012-10-16 | Disposition: A | Discharge status: Rehabilitation Facility | Payer: Self-pay | Attending: Emergency Medicine | Admitting: Emergency Medicine

## 2012-10-16 ENCOUNTER — Emergency Department (HOSPITAL_BASED_OUTPATIENT_CLINIC_OR_DEPARTMENT_OTHER): Payer: Self-pay

## 2012-10-16 DIAGNOSIS — M545 Low back pain, unspecified: Secondary | ICD-10-CM | POA: Insufficient documentation

## 2012-10-16 DIAGNOSIS — R7309 Other abnormal glucose: Secondary | ICD-10-CM | POA: Insufficient documentation

## 2012-10-16 DIAGNOSIS — Z79899 Other long term (current) drug therapy: Secondary | ICD-10-CM | POA: Insufficient documentation

## 2012-10-16 DIAGNOSIS — R5383 Other fatigue: Secondary | ICD-10-CM | POA: Insufficient documentation

## 2012-10-16 DIAGNOSIS — R141 Gas pain: Secondary | ICD-10-CM | POA: Insufficient documentation

## 2012-10-16 DIAGNOSIS — R739 Hyperglycemia, unspecified: Secondary | ICD-10-CM

## 2012-10-16 DIAGNOSIS — Z87440 Personal history of urinary (tract) infections: Secondary | ICD-10-CM | POA: Insufficient documentation

## 2012-10-16 DIAGNOSIS — R142 Eructation: Secondary | ICD-10-CM | POA: Insufficient documentation

## 2012-10-16 DIAGNOSIS — J449 Chronic obstructive pulmonary disease, unspecified: Secondary | ICD-10-CM | POA: Insufficient documentation

## 2012-10-16 DIAGNOSIS — E119 Type 2 diabetes mellitus without complications: Secondary | ICD-10-CM | POA: Insufficient documentation

## 2012-10-16 DIAGNOSIS — J4489 Other specified chronic obstructive pulmonary disease: Secondary | ICD-10-CM | POA: Insufficient documentation

## 2012-10-16 DIAGNOSIS — R5381 Other malaise: Secondary | ICD-10-CM | POA: Insufficient documentation

## 2012-10-16 DIAGNOSIS — Z794 Long term (current) use of insulin: Secondary | ICD-10-CM | POA: Insufficient documentation

## 2012-10-16 DIAGNOSIS — Z9889 Other specified postprocedural states: Secondary | ICD-10-CM | POA: Insufficient documentation

## 2012-10-16 DIAGNOSIS — R110 Nausea: Secondary | ICD-10-CM | POA: Insufficient documentation

## 2012-10-16 DIAGNOSIS — Z8739 Personal history of other diseases of the musculoskeletal system and connective tissue: Secondary | ICD-10-CM | POA: Insufficient documentation

## 2012-10-16 DIAGNOSIS — R3915 Urgency of urination: Secondary | ICD-10-CM | POA: Insufficient documentation

## 2012-10-16 DIAGNOSIS — K5900 Constipation, unspecified: Secondary | ICD-10-CM | POA: Insufficient documentation

## 2012-10-16 DIAGNOSIS — F17200 Nicotine dependence, unspecified, uncomplicated: Secondary | ICD-10-CM | POA: Insufficient documentation

## 2012-10-16 LAB — COMPREHENSIVE METABOLIC PANEL
ALT: 33 U/L (ref 0–53)
AST: 19 U/L (ref 0–37)
Albumin: 3.3 g/dL — ABNORMAL LOW (ref 3.5–5.2)
Calcium: 9.6 mg/dL (ref 8.4–10.5)
GFR calc Af Amer: >90 mL/min (ref >90)
Sodium: 138 mEq/L (ref 135–145)
Total Protein: 6.4 g/dL (ref 6.0–8.3)

## 2012-10-16 LAB — CBC WITH DIFFERENTIAL/PLATELET
Basophils Absolute: 0 10*3/uL (ref 0.0–0.1)
Basophils Relative: 0 % (ref 0–1)
Eosinophils Absolute: 0.1 10*3/uL (ref 0.0–0.7)
Eosinophils Relative: 1 % (ref 0–5)
MCH: 29.2 pg (ref 26.0–34.0)
MCV: 86.7 fL (ref 78.0–100.0)
Platelets: 156 10*3/uL (ref 150–400)
RDW: 13.6 % (ref 11.5–15.5)
WBC: 9.5 10*3/uL (ref 4.0–10.5)

## 2012-10-16 LAB — URINALYSIS, ROUTINE W REFLEX MICROSCOPIC
Bilirubin Urine: NEGATIVE
Hgb urine dipstick: NEGATIVE
Nitrite: NEGATIVE
Protein, ur: NEGATIVE mg/dL
Urobilinogen, UA: 1 mg/dL (ref 0.0–1.0)

## 2012-10-16 MED ORDER — KETOROLAC TROMETHAMINE 30 MG/ML IJ SOLN
30.0000 mg | Freq: Once | INTRAMUSCULAR | Status: AC
  Administered 2012-10-16: 30 mg via INTRAVENOUS
  Filled 2012-10-16: qty 1

## 2012-10-16 MED ORDER — POLYETHYLENE GLYCOL 3350 17 GM/SCOOP PO POWD: 17.0000 g | Freq: Every day | ORAL | Status: DC

## 2012-10-16 MED ORDER — DOCUSATE SODIUM 100 MG PO CAPS: 100.0000 mg | ORAL_CAPSULE | Freq: Two times a day (BID) | ORAL | Status: DC | PRN

## 2012-10-16 MED ORDER — SODIUM CHLORIDE 0.9 % IV BOLUS (SEPSIS)
1000.0000 mL | Freq: Once | INTRAVENOUS | Status: AC
  Administered 2012-10-16: 1000 mL via INTRAVENOUS

## 2012-10-16 MED ORDER — METHOCARBAMOL 100 MG/ML IJ SOLN
1000.0000 mg | Freq: Once | INTRAMUSCULAR | Status: AC
  Administered 2012-10-16: 1000 mg via INTRAVENOUS
  Filled 2012-10-16: qty 10

## 2012-10-16 NOTE — ED Provider Notes (Signed)
History  This chart was scribed for Loren Racer, MD by Shari Heritage, ED Scribe. The patient was seen in room MH05/MH05. Patient's care was started at 1516.   CSN: [id number]  Arrival date & time 10/16/12  1407   First MD Initiated Contact with Patient 10/16/12 1516      Chief Complaint  Patient presents with  . Back Pain  . Urinary Frequency     Patient is a 57 y.o. male presenting with back pain. The history is provided by the patient. No language interpreter was used.  Back Pain  This is a new problem. The current episode started more than 2 days ago. The problem occurs constantly. The problem has not changed since onset.The pain is associated with no known injury. The pain is present in the lumbar spine. The pain does not radiate. The pain is moderate. Pertinent negatives include no fever and no dysuria. He has tried nothing for the symptoms.    HPI Comments: [name] is a 57 y.o. male who presents to the Emergency Department complaining of vague, bilateral lower back pain that is worse on the right and urinary frequency onset 3-4 days ago. Pain is worse with palpation. The pain does not radiate. There is associated fatigue, urinary urgency, abdominal distension and episodic nausea. Patient denies fever, chills, dysuria, diarrhea, constipation, or vomiting. He says that his last BM was yesterday. He denies any straining or difficulty during BMs.  He says that he is drinking fluids and still feels thirsty. Patient states a history of prostate infections. Patient has a medical history of diabetes and states that his blood sugar has ranged from 100-140 over the past several days.   Past Medical History  Diagnosis Date  . DDD (degenerative disc disease), cervical   . Diabetes mellitus without complication   . COPD (chronic obstructive pulmonary disease)     Past Surgical History  Procedure Date  . Back surgery   . Neck surgery     No family history on  file.  History  Substance Use Topics  . Smoking status: Current Every Day Smoker  . Smokeless tobacco: Not on file  . Alcohol Use: No      Review of Systems  Constitutional: Negative for fever and chills.  Gastrointestinal: Positive for nausea and abdominal distention. Negative for vomiting, diarrhea and constipation.  Genitourinary: Positive for urgency and frequency. Negative for dysuria.  Musculoskeletal: Positive for back pain.  All other systems reviewed and are negative.    Allergies  Review of patient's allergies indicates no known allergies.  Home Medications   Current Outpatient Rx  Name  Route  Sig  Dispense  Refill  . CITALOPRAM HYDROBROMIDE 40 MG PO TABS   Oral   Take 40 mg by mouth daily.         Marland Kitchen DOCUSATE SODIUM 100 MG PO CAPS   Oral   Take 1 capsule (100 mg total) by mouth 2 (two) times daily as needed for constipation.   60 capsule   0   . HYDROXYZINE HCL 50 MG PO TABS   Oral   Take 50 mg by mouth 3 (three) times daily as needed.         . INSULIN ISOPHANE & REGULAR (70-30) 100 UNIT/ML [place] SUSP   Subcutaneous   Inject into the skin.         Marland Kitchen MELOXICAM 7.5 MG PO TABS   Oral   Take 1 tablet (7.5 mg total) by mouth daily.  10 tablet   0   . POLYETHYLENE GLYCOL 3350 PO POWD   Oral   Take 17 g by mouth daily.   255 g   0   . PREDNISONE 20 MG PO TABS      3 Tabs PO Days 1-3, then 2 tabs PO Days 4-6, then 1 tab PO Day 7-9, then Half Tab PO Day 10-12   20 tablet   0     Triage Vitals: BP 103/61  Pulse 68  Temp 98.2 F (36.8 C) (Oral)  Resp 18  Ht 5\' 8"  (1.727 m)  Wt 200 lb (90.719 kg)  BMI 30.41 kg/m2  SpO2 96%  Physical Exam  Constitutional: He is oriented to person, place, and time. He appears well-developed and well-nourished. No distress.  HENT:  Head: Normocephalic and atraumatic.  Mouth/Throat: Oropharynx is clear and moist. Mucous membranes are dry (midlly).  Eyes: Conjunctivae normal and EOM are normal. Pupils are  equal, round, and reactive to light.  Cardiovascular: Normal rate, regular rhythm and normal heart sounds.   Pulmonary/Chest: Effort normal and breath sounds normal.  Abdominal: Soft. Bowel sounds are normal. He exhibits distension (mildly). There is no tenderness. There is no rebound, no guarding and no CVA tenderness.       No focal abdominal tenderness.  Musculoskeletal: Normal range of motion. He exhibits tenderness. He exhibits no edema.       Mild right paraspinal lumbar tenderness. No midline CTL tenderness.   Neurological: He is alert and oriented to person, place, and time. No cranial nerve deficit. Coordination normal.       No neurological deficits.    ED Course  Procedures (including critical care time) DIAGNOSTIC STUDIES: Oxygen Saturation is 96% on room air, normal by my interpretation.    COORDINATION OF CARE: 3:31 PM- Patient informed of current plan for treatment and evaluation and agrees with plan at this time.   4:47 PM- Patient has moderate amount of stool in colon. Will wait for IV fluids to run through then discharge with prescriptions Miralax and Colace. Patient verbalizes understanding and agrees with plan.  Labs Reviewed  URINALYSIS, ROUTINE W REFLEX MICROSCOPIC - Abnormal; Notable for the following:    Glucose, UA 100 (*)     All other components within normal limits  COMPREHENSIVE METABOLIC PANEL - Abnormal; Notable for the following:    Glucose, Bld 207 (*)     Albumin 3.3 (*)     Total Bilirubin 0.1 (*)     GFR calc non Af Amer 83 (*)     All other components within normal limits  CBC WITH DIFFERENTIAL    Dg Abd Acute W/chest  10/16/2012  *RADIOLOGY REPORT*  Clinical Data: Lower back pain.  ACUTE ABDOMEN SERIES (ABDOMEN 2 VIEW & CHEST 1 VIEW)  Comparison: 06/06/2007 and 05/04/2006  Findings: There are linear densities at the left lung base that are suggestive for scarring.  Otherwise, the lungs are clear. Heart and mediastinum are within normal limits.   Surgical plate in the lower cervical spine.  No evidence of free air.  Surgical fusion at L5- S1.  There is a moderate amount of stool in the abdomen. Nonobstructive bowel gas pattern.  Stable phlebolith in the right hemi pelvis.  IMPRESSION: Nonspecific bowel gas pattern.  Moderate amount of stool.  Evidence for left basilar lung scarring.   Original Report Authenticated By: Richarda Overlie, M.D.      1. Constipation   2. Hyperglycemia  MDM  I personally performed the services described in this documentation, which was scribed in my presence. The recorded information has been reviewed and is accurate.    Loren Racer, MD 10/16/12 [phone]

## 2012-10-16 NOTE — ED Notes (Signed)
Lower back pain. Nausea, aching all over. States he thinks he has a UTI.

## 2012-10-30 ENCOUNTER — Encounter (HOSPITAL_BASED_OUTPATIENT_CLINIC_OR_DEPARTMENT_OTHER): Payer: Self-pay | Admitting: *Deleted

## 2012-10-30 ENCOUNTER — Emergency Department (HOSPITAL_BASED_OUTPATIENT_CLINIC_OR_DEPARTMENT_OTHER)
Admission: EM | Admit: 2012-10-30 | Discharge: 2012-10-30 | Disposition: A | Discharge status: Home / Self Care | Payer: Self-pay | Attending: Emergency Medicine | Admitting: Emergency Medicine

## 2012-10-30 DIAGNOSIS — E119 Type 2 diabetes mellitus without complications: Secondary | ICD-10-CM | POA: Insufficient documentation

## 2012-10-30 DIAGNOSIS — J449 Chronic obstructive pulmonary disease, unspecified: Secondary | ICD-10-CM | POA: Insufficient documentation

## 2012-10-30 DIAGNOSIS — F17200 Nicotine dependence, unspecified, uncomplicated: Secondary | ICD-10-CM | POA: Insufficient documentation

## 2012-10-30 DIAGNOSIS — Z79899 Other long term (current) drug therapy: Secondary | ICD-10-CM | POA: Insufficient documentation

## 2012-10-30 DIAGNOSIS — Z760 Encounter for issue of repeat prescription: Secondary | ICD-10-CM | POA: Insufficient documentation

## 2012-10-30 DIAGNOSIS — Z8739 Personal history of other diseases of the musculoskeletal system and connective tissue: Secondary | ICD-10-CM | POA: Insufficient documentation

## 2012-10-30 DIAGNOSIS — J4489 Other specified chronic obstructive pulmonary disease: Secondary | ICD-10-CM | POA: Insufficient documentation

## 2012-10-30 MED ORDER — BECLOMETHASONE DIPROPIONATE 40 MCG/ACT IN AERS: 2.0000 | INHALATION_SPRAY | Freq: Two times a day (BID) | RESPIRATORY_TRACT | Status: DC

## 2012-10-30 NOTE — ED Provider Notes (Signed)
History     CSN: [id number]  Arrival date & time 10/30/12  1445   First MD Initiated Contact with Patient 10/30/12 1456      Chief Complaint  Patient presents with  . Medication Refill    (Consider location/radiation/quality/duration/timing/severity/associated sxs/prior treatment) HPI Comments: Patient is a 57 year old male who presents for a medication change. Patient was seen at an urgent care center where he was prescribed Advair, which he has been taking for 3 years. Patient is now at [facility] and cannot fill the prescription because it is too expensive. Patient reports previously getting Advair samples from a free clinic in [place] which he can no longer get because he does not live there. Patient takes the Advair for COPD. Patient denies any symptoms or current complaints.    Past Medical History  Diagnosis Date  . DDD (degenerative disc disease), cervical   . Diabetes mellitus without complication   . COPD (chronic obstructive pulmonary disease)     Past Surgical History  Procedure Laterality Date  . Back surgery    . Neck surgery      No family history on file.  History  Substance Use Topics  . Smoking status: Current Every Day Smoker  . Smokeless tobacco: Not on file  . Alcohol Use: No      Review of Systems  Constitutional:       Medication refill  All other systems reviewed and are negative.    Allergies  Review of patient's allergies indicates no known allergies.  Home Medications   Current Outpatient Rx  Name  Route  Sig  Dispense  Refill  . citalopram (CELEXA) 40 MG tablet   Oral   Take 40 mg by mouth daily.         Marland Kitchen docusate sodium (COLACE) 100 MG capsule   Oral   Take 1 capsule (100 mg total) by mouth 2 (two) times daily as needed for constipation.   60 capsule   0   . hydrOXYzine (ATARAX/VISTARIL) 50 MG tablet   Oral   Take 50 mg by mouth 3 (three) times daily as needed.         . insulin  NPH-insulin regular (NOVOLIN 70/30) (70-30) 100 UNIT/ML injection   Subcutaneous   Inject into the skin.         . meloxicam (MOBIC) 7.5 MG tablet   Oral   Take 1 tablet (7.5 mg total) by mouth daily.   10 tablet   0   . polyethylene glycol powder (GLYCOLAX/MIRALAX) powder   Oral   Take 17 g by mouth daily.   255 g   0   . predniSONE (DELTASONE) 20 MG tablet      3 Tabs PO Days 1-3, then 2 tabs PO Days 4-6, then 1 tab PO Day 7-9, then Half Tab PO Day 10-12   20 tablet   0     BP 107/68  Pulse 56  Temp(Src) 98.7 F (37.1 C) (Oral)  Resp 18  SpO2 100%  Physical Exam  Nursing note and vitals reviewed. Constitutional: He appears well-developed and well-nourished. No distress.  HENT:  Head: Normocephalic and atraumatic.  Eyes: Conjunctivae are normal.  Neck: Normal range of motion.  Cardiovascular: Normal rate and regular rhythm.  Exam reveals no gallop and no friction rub.   No murmur heard. Pulmonary/Chest: Effort normal and breath sounds normal. He has no wheezes. He has no rales. He exhibits no tenderness.  Abdominal: Soft. There  is no tenderness.  Musculoskeletal: Normal range of motion.  Neurological: He is alert.  Speech is goal-oriented. Moves limbs without ataxia.   Skin: Skin is warm and dry.  Psychiatric: He has a normal mood and affect. His behavior is normal.    ED Course  Procedures (including critical care time)  Labs Reviewed - No data to display No results found.   1. Medication refill       MDM  3:49 PM Patient will have his prescription changed to QVar per pharmacy recommendation. The price here at [facility] is $45 which I told the patient. The nurse called [facility], who did not answer the phone. We explained to the patient that another pharmacy may be able to match the price here or come here to fill the prescription. I advised the patient to enroll in an assistance program for prescriptions but he "has no time." Patient will be discharged  without further evaluation.         Emilia Beck, PA-C 10/30/12 1911

## 2012-10-30 NOTE — ED Notes (Signed)
States he is a pt at [facility] and was sent here to get a cheaper Rx to replace Advair.

## 2012-10-30 NOTE — ED Notes (Signed)
Patient sent here today from [facility] to have his Advair changed to a cheaper medication.  Reviewed with Pharmacy and received recommendation that Qvar would be appropriate.  Quoted price of 45.00.  Call placed to [facility] and left message on Nurses voicemail.

## 2012-10-31 NOTE — ED Provider Notes (Signed)
Medical screening examination/treatment/procedure(s) were performed by non-physician practitioner and as supervising physician I was immediately available for consultation/collaboration.   Joya Gaskins, MD 10/31/12 [phone]

## 2012-12-12 ENCOUNTER — Encounter (HOSPITAL_BASED_OUTPATIENT_CLINIC_OR_DEPARTMENT_OTHER): Payer: Self-pay | Admitting: *Deleted

## 2012-12-12 ENCOUNTER — Emergency Department (HOSPITAL_BASED_OUTPATIENT_CLINIC_OR_DEPARTMENT_OTHER)
Admission: EM | Admit: 2012-12-12 | Discharge: 2012-12-12 | Disposition: A | Discharge status: Home / Self Care | Payer: Self-pay | Attending: Emergency Medicine | Admitting: Emergency Medicine

## 2012-12-12 ENCOUNTER — Emergency Department (HOSPITAL_BASED_OUTPATIENT_CLINIC_OR_DEPARTMENT_OTHER): Payer: Self-pay

## 2012-12-12 DIAGNOSIS — Z79899 Other long term (current) drug therapy: Secondary | ICD-10-CM | POA: Insufficient documentation

## 2012-12-12 DIAGNOSIS — J4489 Other specified chronic obstructive pulmonary disease: Secondary | ICD-10-CM | POA: Insufficient documentation

## 2012-12-12 DIAGNOSIS — L723 Sebaceous cyst: Secondary | ICD-10-CM | POA: Insufficient documentation

## 2012-12-12 DIAGNOSIS — M25531 Pain in right wrist: Secondary | ICD-10-CM

## 2012-12-12 DIAGNOSIS — J449 Chronic obstructive pulmonary disease, unspecified: Secondary | ICD-10-CM | POA: Insufficient documentation

## 2012-12-12 DIAGNOSIS — E119 Type 2 diabetes mellitus without complications: Secondary | ICD-10-CM | POA: Insufficient documentation

## 2012-12-12 DIAGNOSIS — Z8739 Personal history of other diseases of the musculoskeletal system and connective tissue: Secondary | ICD-10-CM | POA: Insufficient documentation

## 2012-12-12 DIAGNOSIS — M25539 Pain in unspecified wrist: Secondary | ICD-10-CM | POA: Insufficient documentation

## 2012-12-12 DIAGNOSIS — F17200 Nicotine dependence, unspecified, uncomplicated: Secondary | ICD-10-CM | POA: Insufficient documentation
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
## 2012-12-12 NOTE — ED Notes (Signed)
Pain in his right wrist. He also wants to have a spot on the left side of his face looked at. The spot has been there for a month and seems to be getting bigger. He is an inpatient [facility]. Has been there 3 months. Detox from alcohol and Xanax.

## 2012-12-13 NOTE — ED Provider Notes (Signed)
Medical screening examination/treatment/procedure(s) were performed by non-physician practitioner and as supervising physician I was immediately available for consultation/collaboration.  Geoffery Lyons, MD 12/13/12 [id number]

## 2016-02-29 ENCOUNTER — Emergency Department: Payer: Medicaid Other

## 2016-02-29 ENCOUNTER — Other Ambulatory Visit: Payer: Self-pay

## 2016-02-29 ENCOUNTER — Observation Stay
Admission: EM | Admit: 2016-02-29 | Discharge: 2016-03-02 | Disposition: A | Discharge status: Home / Self Care | Payer: Medicaid Other | Attending: Internal Medicine | Admitting: Internal Medicine

## 2016-02-29 ENCOUNTER — Encounter: Payer: Self-pay | Admitting: Emergency Medicine

## 2016-02-29 DIAGNOSIS — J449 Chronic obstructive pulmonary disease, unspecified: Secondary | ICD-10-CM | POA: Insufficient documentation

## 2016-02-29 DIAGNOSIS — F19939 Other psychoactive substance use, unspecified with withdrawal, unspecified: Secondary | ICD-10-CM | POA: Diagnosis not present

## 2016-02-29 DIAGNOSIS — N390 Urinary tract infection, site not specified: Secondary | ICD-10-CM | POA: Insufficient documentation

## 2016-02-29 DIAGNOSIS — Z803 Family history of malignant neoplasm of breast: Secondary | ICD-10-CM | POA: Diagnosis not present

## 2016-02-29 DIAGNOSIS — Z800 Family history of malignant neoplasm of digestive organs: Secondary | ICD-10-CM | POA: Diagnosis not present

## 2016-02-29 DIAGNOSIS — M5030 Other cervical disc degeneration, unspecified cervical region: Secondary | ICD-10-CM | POA: Insufficient documentation

## 2016-02-29 DIAGNOSIS — Z794 Long term (current) use of insulin: Secondary | ICD-10-CM | POA: Insufficient documentation

## 2016-02-29 DIAGNOSIS — E119 Type 2 diabetes mellitus without complications: Secondary | ICD-10-CM | POA: Insufficient documentation

## 2016-02-29 DIAGNOSIS — R443 Hallucinations, unspecified: Secondary | ICD-10-CM | POA: Insufficient documentation

## 2016-02-29 DIAGNOSIS — F13939 Sedative, hypnotic or anxiolytic use, unspecified with withdrawal, unspecified: Secondary | ICD-10-CM | POA: Diagnosis present

## 2016-02-29 DIAGNOSIS — F13931 Sedative, hypnotic or anxiolytic use, unspecified with withdrawal delirium: Secondary | ICD-10-CM

## 2016-02-29 DIAGNOSIS — Z87898 Personal history of other specified conditions: Secondary | ICD-10-CM | POA: Diagnosis not present

## 2016-02-29 DIAGNOSIS — F13239 Sedative, hypnotic or anxiolytic dependence with withdrawal, unspecified: Secondary | ICD-10-CM | POA: Diagnosis present

## 2016-02-29 DIAGNOSIS — F22 Delusional disorders: Secondary | ICD-10-CM | POA: Diagnosis not present

## 2016-02-29 DIAGNOSIS — F13231 Sedative, hypnotic or anxiolytic dependence with withdrawal delirium: Secondary | ICD-10-CM

## 2016-02-29 DIAGNOSIS — Z79899 Other long term (current) drug therapy: Secondary | ICD-10-CM | POA: Diagnosis not present

## 2016-02-29 DIAGNOSIS — F17210 Nicotine dependence, cigarettes, uncomplicated: Secondary | ICD-10-CM | POA: Insufficient documentation

## 2016-02-29 DIAGNOSIS — F419 Anxiety disorder, unspecified: Secondary | ICD-10-CM | POA: Diagnosis not present

## 2016-02-29 DIAGNOSIS — B9620 Unspecified Escherichia coli [E. coli] as the cause of diseases classified elsewhere: Secondary | ICD-10-CM | POA: Insufficient documentation

## 2016-02-29 HISTORY — DX: Sedative, hypnotic or anxiolytic dependence with withdrawal, unspecified: F13.239

## 2016-02-29 HISTORY — DX: Sedative, hypnotic or anxiolytic use, unspecified with withdrawal, unspecified: F13.939

## 2016-02-29 LAB — CBC
HEMATOCRIT: 40.8 % (ref 40.0–52.0)
Hemoglobin: 13.7 g/dL (ref 13.0–18.0)
MCH: 29.2 pg (ref 26.0–34.0)
MCHC: 33.5 g/dL (ref 32.0–36.0)
MCV: 87.3 fL (ref 80.0–100.0)
PLATELETS: 235 10*3/uL (ref 150–440)
RBC: 4.67 MIL/uL (ref 4.40–5.90)
RDW: 13.8 % (ref 11.5–14.5)
WBC: 18.5 10*3/uL — AB (ref 3.8–10.6)

## 2016-02-29 LAB — URINALYSIS COMPLETE WITH MICROSCOPIC (ARMC ONLY)
Bilirubin Urine: NEGATIVE
Glucose, UA: NEGATIVE mg/dL
Ketones, ur: NEGATIVE mg/dL
NITRITE: NEGATIVE
PH: 6 (ref 5.0–8.0)
PROTEIN: NEGATIVE mg/dL
RBC / HPF: NONE SEEN RBC/hpf (ref 0–5)
SPECIFIC GRAVITY, URINE: 1.001 — AB (ref 1.005–1.030)
Squamous Epithelial / LPF: NONE SEEN

## 2016-02-29 LAB — BASIC METABOLIC PANEL
Anion gap: 10 (ref 5–15)
BUN: 13 mg/dL (ref 6–20)
CHLORIDE: 100 mmol/L — AB (ref 101–111)
CO2: 23 mmol/L (ref 22–32)
CREATININE: 1.08 mg/dL (ref 0.61–1.24)
Calcium: 9.5 mg/dL (ref 8.9–10.3)
GFR calc non Af Amer: >60 mL/min (ref >60)
Glucose, Bld: 170 mg/dL — ABNORMAL HIGH (ref 65–99)
POTASSIUM: 3.9 mmol/L (ref 3.5–5.1)
SODIUM: 133 mmol/L — AB (ref 135–145)

## 2016-02-29 LAB — URINE DRUG SCREEN, QUALITATIVE (ARMC ONLY)
Amphetamines, Ur Screen: NOT DETECTED
BARBITURATES, UR SCREEN: NOT DETECTED
BENZODIAZEPINE, UR SCRN: POSITIVE — AB
CANNABINOID 50 NG, UR ~~LOC~~: NOT DETECTED
Cocaine Metabolite,Ur ~~LOC~~: NOT DETECTED
MDMA (Ecstasy)Ur Screen: NOT DETECTED
Methadone Scn, Ur: NOT DETECTED
Opiate, Ur Screen: NOT DETECTED
PHENCYCLIDINE (PCP) UR S: NOT DETECTED
Tricyclic, Ur Screen: NOT DETECTED

## 2016-02-29 LAB — ETHANOL

## 2016-02-29 LAB — GLUCOSE, CAPILLARY: GLUCOSE-CAPILLARY: 144 mg/dL — AB (ref 65–99)

## 2016-02-29 MED ORDER — ONE-DAILY MULTI VITAMINS PO TABS: 1.0000 | ORAL_TABLET | Freq: Every day | ORAL | Status: DC

## 2016-02-29 MED ORDER — INSULIN ASPART 100 UNIT/ML ~~LOC~~ SOLN: 0.0000 [IU] | Freq: Every day | SUBCUTANEOUS | Status: DC

## 2016-02-29 MED ORDER — ACETAMINOPHEN 650 MG RE SUPP: 650.0000 mg | Freq: Four times a day (QID) | RECTAL | Status: DC | PRN

## 2016-02-29 MED ORDER — PHENYTOIN SODIUM EXTENDED 100 MG PO CAPS
200.0000 mg | ORAL_CAPSULE | Freq: Two times a day (BID) | ORAL | Status: DC
  Administered 2016-02-29 – 2016-03-02 (×4): 200 mg via ORAL
  Filled 2016-02-29 (×4): qty 2

## 2016-02-29 MED ORDER — LORAZEPAM 2 MG/ML IJ SOLN
1.0000 mg | Freq: Four times a day (QID) | INTRAMUSCULAR | Status: DC | PRN
  Administered 2016-03-01: 1 mg via INTRAVENOUS
  Filled 2016-02-29: qty 1

## 2016-02-29 MED ORDER — VITAMIN B-1 100 MG PO TABS: 100.0000 mg | ORAL_TABLET | Freq: Every day | ORAL | Status: DC

## 2016-02-29 MED ORDER — INSULIN ASPART 100 UNIT/ML ~~LOC~~ SOLN
0.0000 [IU] | Freq: Three times a day (TID) | SUBCUTANEOUS | Status: DC
  Administered 2016-03-01 – 2016-03-02 (×2): 1 [IU] via SUBCUTANEOUS
  Filled 2016-02-29 (×3): qty 1

## 2016-02-29 MED ORDER — GLIPIZIDE 5 MG PO TABS
10.0000 mg | ORAL_TABLET | Freq: Every day | ORAL | Status: DC
  Administered 2016-03-01: 10 mg via ORAL
  Filled 2016-02-29 (×2): qty 2

## 2016-02-29 MED ORDER — ONDANSETRON HCL 4 MG PO TABS: 4.0000 mg | ORAL_TABLET | Freq: Four times a day (QID) | ORAL | Status: DC | PRN

## 2016-02-29 MED ORDER — BUPROPION HCL 75 MG PO TABS
75.0000 mg | ORAL_TABLET | Freq: Every day | ORAL | Status: DC
  Filled 2016-02-29: qty 1

## 2016-02-29 MED ORDER — VITAMIN B-1 100 MG PO TABS
100.0000 mg | ORAL_TABLET | Freq: Every day | ORAL | Status: DC
  Administered 2016-03-01 – 2016-03-02 (×2): 100 mg via ORAL
  Filled 2016-02-29 (×2): qty 1

## 2016-02-29 MED ORDER — SODIUM CHLORIDE 0.9 % IV SOLN
INTRAVENOUS | Status: DC
  Administered 2016-02-29 – 2016-03-01 (×2): via INTRAVENOUS

## 2016-02-29 MED ORDER — GABAPENTIN 300 MG PO CAPS
600.0000 mg | ORAL_CAPSULE | Freq: Three times a day (TID) | ORAL | Status: DC
  Administered 2016-02-29 – 2016-03-01 (×3): 600 mg via ORAL
  Filled 2016-02-29 (×3): qty 2

## 2016-02-29 MED ORDER — QUETIAPINE FUMARATE ER 200 MG PO TB24
800.0000 mg | ORAL_TABLET | Freq: Every day | ORAL | Status: DC
  Administered 2016-02-29 – 2016-03-01 (×2): 800 mg via ORAL
  Filled 2016-02-29 (×2): qty 4

## 2016-02-29 MED ORDER — BUSPIRONE HCL 5 MG PO TABS
15.0000 mg | ORAL_TABLET | Freq: Two times a day (BID) | ORAL | Status: DC
  Administered 2016-02-29 – 2016-03-02 (×4): 15 mg via ORAL
  Filled 2016-02-29 (×4): qty 1

## 2016-02-29 MED ORDER — HYDROCODONE-ACETAMINOPHEN 5-325 MG PO TABS
1.0000 | ORAL_TABLET | ORAL | Status: DC | PRN
  Administered 2016-03-01: 1 via ORAL
  Filled 2016-02-29: qty 1

## 2016-02-29 MED ORDER — THIAMINE HCL 100 MG/ML IJ SOLN: 100.0000 mg | Freq: Every day | INTRAMUSCULAR | Status: DC

## 2016-02-29 MED ORDER — ACETAMINOPHEN 325 MG PO TABS
650.0000 mg | ORAL_TABLET | Freq: Four times a day (QID) | ORAL | Status: DC | PRN
  Administered 2016-02-29: 650 mg via ORAL
  Filled 2016-02-29: qty 2

## 2016-02-29 MED ORDER — LORAZEPAM 2 MG/ML IJ SOLN
2.0000 mg | Freq: Once | INTRAMUSCULAR | Status: AC
  Administered 2016-02-29: 2 mg via INTRAVENOUS
  Filled 2016-02-29: qty 1

## 2016-02-29 MED ORDER — FOLIC ACID 1 MG PO TABS
1.0000 mg | ORAL_TABLET | Freq: Every day | ORAL | Status: DC
  Administered 2016-03-01 – 2016-03-02 (×2): 1 mg via ORAL
  Filled 2016-02-29 (×2): qty 1

## 2016-02-29 MED ORDER — CHLORDIAZEPOXIDE HCL 25 MG PO CAPS
25.0000 mg | ORAL_CAPSULE | Freq: Three times a day (TID) | ORAL | Status: DC
  Administered 2016-02-29 – 2016-03-02 (×5): 25 mg via ORAL
  Filled 2016-02-29 (×5): qty 1

## 2016-02-29 MED ORDER — ONDANSETRON HCL 4 MG/2ML IJ SOLN: 4.0000 mg | Freq: Four times a day (QID) | INTRAMUSCULAR | Status: DC | PRN

## 2016-02-29 MED ORDER — LORAZEPAM 1 MG PO TABS: 1.0000 mg | ORAL_TABLET | Freq: Four times a day (QID) | ORAL | Status: DC | PRN

## 2016-02-29 MED ORDER — ENOXAPARIN SODIUM 40 MG/0.4ML ~~LOC~~ SOLN
40.0000 mg | SUBCUTANEOUS | Status: DC
  Administered 2016-02-29 – 2016-03-01 (×2): 40 mg via SUBCUTANEOUS
  Filled 2016-02-29 (×2): qty 0.4

## 2016-02-29 MED ORDER — ADULT MULTIVITAMIN W/MINERALS CH
1.0000 | ORAL_TABLET | Freq: Every day | ORAL | Status: DC
Start: 2016-02-29 — End: 2016-03-02
  Administered 2016-03-01 – 2016-03-02 (×2): 1 via ORAL
  Filled 2016-02-29 (×2): qty 1

## 2016-02-29 MED ORDER — INSULIN DETEMIR 100 UNIT/ML ~~LOC~~ SOLN
35.0000 [IU] | Freq: Every day | SUBCUTANEOUS | Status: DC
  Administered 2016-02-29 – 2016-03-01 (×2): 35 [IU] via SUBCUTANEOUS
  Filled 2016-02-29 (×4): qty 0.35

## 2016-02-29 MED ORDER — FOLIC ACID 1 MG PO TABS: 1.0000 mg | ORAL_TABLET | Freq: Every day | ORAL | Status: DC

## 2016-02-29 NOTE — BH Assessment (Signed)
Assessment Note  [name] is an 60 y.o. male who presents to the ER for hallucinating due to withdrawals from Benzo's use.  [facility] (02/28/2016).  While there, he started seeing and hearing things. Patient states, "I was watching TV in the room but there was no TV in there." Patient also reports of hearing things as well. He admits to abusing Xanax on a daily basis. For the last 6 weeks, he's ingested "10 to 15 bars a day." Each "Xanax Bar," are 2mg .   According to [facility] ([phone]), patient was referred to them by [facility]. This morning staff noticed he was responding to internal stimuli and later discovered he had urinated throughout his room. They believe it was due to confusion and his mental state at the time.  He's currently living in [place], [place] and receives outpatient treatment with [facility]. He's in their SAIOP. In the past, he was living in their residential program but transitioned into an [address].  Patient denies SI/HI and reports of having no history of violence or aggression.    Diagnosis: Substance Abuse Disorder  Past Medical History:  Past Medical History  Diagnosis Date  . DDD (degenerative disc disease), cervical   . Diabetes mellitus without complication (HCC)   . COPD (chronic obstructive pulmonary disease) (HCC)   . Benzodiazepine withdrawal [facility])     Past Surgical History  Procedure Laterality Date  . Back surgery    . Neck surgery      Family History:  Family History  Problem Relation Age of Onset  . Breast cancer Mother   . Liver cancer Father     Social History:  reports that he has been smoking Cigarettes.  He has a 40 pack-year smoking history. He does not have any smokeless tobacco history on file. He reports that he does not drink alcohol or use illicit drugs.  Additional Social History:  Alcohol / Drug Use Pain Medications: See  PTA Prescriptions: See PTA Over the Counter: See PTA History of alcohol / drug use?: Yes Longest period of sobriety (when/how long): Unknown Negative Consequences of Use: Financial, Work / Programmer, multimedia, Personal relationships Withdrawal Symptoms: Nausea / Vomiting, Tremors, Tingling, Sweats Substance #1 Name of Substance 1: Xanax (Benzo) 1 - Age of First Use: 56 1 - Amount (size/oz): "10 to 15 (Xanax) Bars (2mg )" 1 - Frequency: Daily 1 - Duration: 6 months 1 - Last Use / Amount: Unknown  CIWA: CIWA-Ar BP: 105/75 mmHg Pulse Rate: 96 Nausea and Vomiting: no nausea and no vomiting Tactile Disturbances: moderately severe hallucinations Tremor: two Auditory Disturbances: not present Paroxysmal Sweats: no sweat visible Visual Disturbances: very mild sensitivity Anxiety: mildly anxious Headache, Fullness in Head: extremely severe Agitation: somewhat more than normal activity Orientation and Clouding of Sensorium: oriented and can do serial additions CIWA-Ar Total: 15 COWS:    Allergies: No Known Allergies  Home Medications:  (Not in a hospital admission)  OB/GYN Status:  No LMP for male patient.  General Assessment Data Location of Assessment: [facility] Assessment: In system Is this a Tele or Face-to-Face Assessment?: Face-to-Face Is this an Initial Assessment or a Re-assessment for this encounter?: Initial Assessment Marital status: Single Maiden name: n/a Is patient pregnant?: No Pregnancy Status: No Living Arrangements: Other (Comment) ([facility]) Can pt return to current living arrangement?: Yes Admission Status: Voluntary Is patient capable of signing voluntary admission?: Yes Referral Source: Other ([facility]) Insurance  type: MCD  Medical Screening Exam [facility] Walk-in ONLY) Medical Exam completed: Yes  Crisis Care Plan Living Arrangements: Other (Comment) [facility] House) Legal Guardian: Other: (None) Name of Psychiatrist: Reports of none Name of Therapist: Arlys John  ([facility], [place])  Education Status Is patient currently in school?: No Current Grade: n/a Highest grade of school patient has completed: Unknown Name of school: n/a Contact person: n/a  Risk to self with the past 6 months Suicidal Ideation: No Has patient been a risk to self within the past 6 months prior to admission? : No Suicidal Intent: No Has patient had any suicidal intent within the past 6 months prior to admission? : No Is patient at risk for suicide?: No Suicidal Plan?: No Has patient had any suicidal plan within the past 6 months prior to admission? : No Access to Means: No What has been your use of drugs/alcohol within the last 12 months?: Xanax Previous Attempts/Gestures: No How many times?: 0 Other Self Harm Risks: Active Addiction Triggers for Past Attempts: None known Intentional Self Injurious Behavior: None Family Suicide History: No Recent stressful life event(s): Other (Comment) (Active Addiction) Persecutory voices/beliefs?: No Depression: Yes Depression Symptoms: Guilt, Isolating, Fatigue, Feeling worthless/self pity (.) Substance abuse history and/or treatment for substance abuse?: Yes Suicide prevention information given to non-admitted patients: Not applicable  Risk to Others within the past 6 months Homicidal Ideation: No Does patient have any lifetime risk of violence toward others beyond the six months prior to admission? : No Thoughts of Harm to Others: No Current Homicidal Intent: No Current Homicidal Plan: No Access to Homicidal Means: No Identified Victim: Reports of none History of harm to others?: No Assessment of Violence: None Noted Violent Behavior Description: Reports of none Does patient have access to weapons?: No Criminal Charges Pending?: No Does patient have a court date: No Is patient on probation?: No  Psychosis Hallucinations: Auditory, Visual Delusions: None noted  Mental Status  Report Appearance/Hygiene: In scrubs, Unremarkable, In hospital gown Eye Contact: Fair Motor Activity: Unable to assess Speech: Logical/coherent, Soft, Slow Level of Consciousness: Drowsy Mood: Depressed, Helpless, Guilty, Sad, Pleasant Affect: Anxious, Appropriate to circumstance, Depressed Anxiety Level: Minimal Thought Processes: Coherent, Relevant Judgement: Partial Orientation: Place, Person, Time, Situation, Appropriate for developmental age Obsessive Compulsive Thoughts/Behaviors: Minimal  Cognitive Functioning Concentration: Decreased Memory: Remote Intact, Recent Impaired IQ: Average Insight: Poor Impulse Control: Poor Appetite: Fair Weight Loss: 0 Weight Gain: 0 Sleep: Decreased Total Hours of Sleep: 4 Vegetative Symptoms: None  ADLScreening [facility] Assessment Services) Patient's cognitive ability adequate to safely complete daily activities?: Yes Patient able to express need for assistance with ADLs?: Yes Independently performs ADLs?: Yes (appropriate for developmental age)  Prior Inpatient Therapy Prior Inpatient Therapy: Yes Prior Therapy Dates: Patient couldn't remember the dates Prior Therapy Facilty/Provider(s): [place], [place], [place] [facility] Reason for Treatment: Depression & Substance Use  Prior Outpatient Therapy Prior Outpatient Therapy: Yes Prior Therapy Dates: Current Prior Therapy Facilty/Provider(s): [facility] Reason for Treatment: Substance Use (SAIOP) ([place], [place]) Does patient have an ACCT team?: No Does patient have Intensive In-House Services?  : No Does patient have [facility] services? : No Does patient have P4CC services?: No  ADL Screening (condition at time of admission) Patient's cognitive ability adequate to safely complete daily activities?: Yes Is the patient deaf or have difficulty hearing?: No Does the patient have difficulty seeing, even when wearing glasses/contacts?: No Does the patient have difficulty  concentrating, remembering, or making decisions?: No  Patient able to express need for assistance with ADLs?: Yes Does the patient have difficulty dressing or bathing?: No Independently performs ADLs?: Yes (appropriate for developmental age) Does the patient have difficulty walking or climbing stairs?: No Weakness of Legs: None Weakness of Arms/Hands: None  Home Assistive Devices/Equipment Home Assistive Devices/Equipment: None  Therapy Consults (therapy consults require a physician order) PT Evaluation Needed: No OT Evalulation Needed: No SLP Evaluation Needed: No Abuse/Neglect Assessment (Assessment to be complete while patient is alone) Physical Abuse: Denies Verbal Abuse: Denies Sexual Abuse: Denies Exploitation of patient/patient's resources: Denies Self-Neglect: Denies Values / Beliefs Cultural Requests During Hospitalization: None Spiritual Requests During Hospitalization: None Consults Spiritual Care Consult Needed: No Social Work Consult Needed: No Merchant navy officerAdvance Directives (For Healthcare) Does patient have an advance directive?: No Would patient like information on creating an advanced directive?: No - patient declined information    Additional Information 1:1 In Past 12 Months?: No CIRT Risk: No Elopement Risk: No Does patient have medical clearance?: No  Child/Adolescent Assessment Running Away Risk: Denies (Patien is an adult)  Disposition:  Disposition Initial Assessment Completed for this Encounter: Yes Disposition of Patient: Other dispositions (Being admitted to Medical Floor)  On Site Evaluation by:   Reviewed with Physician:    Lilyan Gilfordalvin J. Agam Davenport MS, LCAS, LPC, NCC, CCSI Therapeutic Triage Specialist 02/29/2016 6:13 PM
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
## 2016-03-01 DIAGNOSIS — F13231 Sedative, hypnotic or anxiolytic dependence with withdrawal delirium: Secondary | ICD-10-CM

## 2016-03-01 LAB — BASIC METABOLIC PANEL
ANION GAP: 7 (ref 5–15)
BUN: 13 mg/dL (ref 6–20)
CALCIUM: 9.1 mg/dL (ref 8.9–10.3)
CO2: 25 mmol/L (ref 22–32)
Chloride: 109 mmol/L (ref 101–111)
Creatinine, Ser: 1 mg/dL (ref 0.61–1.24)
GFR calc non Af Amer: >60 mL/min (ref >60)
Glucose, Bld: 86 mg/dL (ref 65–99)
Potassium: 3.6 mmol/L (ref 3.5–5.1)
Sodium: 141 mmol/L (ref 135–145)

## 2016-03-01 LAB — CBC
HCT: 38.3 % — ABNORMAL LOW (ref 40.0–52.0)
HEMOGLOBIN: 12.6 g/dL — AB (ref 13.0–18.0)
MCH: 28.6 pg (ref 26.0–34.0)
MCHC: 33 g/dL (ref 32.0–36.0)
MCV: 86.5 fL (ref 80.0–100.0)
Platelets: 216 10*3/uL (ref 150–440)
RBC: 4.43 MIL/uL (ref 4.40–5.90)
RDW: 13.9 % (ref 11.5–14.5)
WBC: 13.3 10*3/uL — ABNORMAL HIGH (ref 3.8–10.6)

## 2016-03-01 LAB — GLUCOSE, CAPILLARY
GLUCOSE-CAPILLARY: 71 mg/dL (ref 65–99)
GLUCOSE-CAPILLARY: 134 mg/dL — AB (ref 65–99)
GLUCOSE-CAPILLARY: 74 mg/dL (ref 65–99)
GLUCOSE-CAPILLARY: 84 mg/dL (ref 65–99)
Glucose-Capillary: 59 mg/dL — ABNORMAL LOW (ref 65–99)

## 2016-03-01 MED ORDER — GABAPENTIN 300 MG PO CAPS
300.0000 mg | ORAL_CAPSULE | Freq: Three times a day (TID) | ORAL | Status: DC
  Administered 2016-03-01 – 2016-03-02 (×2): 300 mg via ORAL
  Filled 2016-03-01 (×2): qty 1

## 2016-03-01 MED ORDER — DEXTROSE 5 % IV SOLN
1.0000 g | INTRAVENOUS | Status: DC
  Administered 2016-03-01 – 2016-03-02 (×2): 1 g via INTRAVENOUS
  Filled 2016-03-01 (×2): qty 10
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
## 2016-03-01 NOTE — Progress Notes (Addendum)
[facility] [place] at [facility]   PATIENT NAME: [name]    MR#:  [id number]  DATE OF BIRTH:  [birth year]  SUBJECTIVE:  CHIEF COMPLAINT:   Chief Complaint  Patient presents with  . Hallucinations   Still confused, no complaint. REVIEW OF SYSTEMS:  CONSTITUTIONAL: No fever, fatigue or weakness.  EYES: No blurred or double vision.  EARS, NOSE, AND THROAT: No tinnitus or ear pain.  RESPIRATORY: No cough, shortness of breath, wheezing or hemoptysis.  CARDIOVASCULAR: No chest pain, orthopnea, edema.  GASTROINTESTINAL: No nausea, vomiting, diarrhea or abdominal pain.  GENITOURINARY: No dysuria, hematuria.  ENDOCRINE: No polyuria, nocturia,  HEMATOLOGY: No anemia, easy bruising or bleeding SKIN: No rash or lesion. MUSCULOSKELETAL: No joint pain or arthritis.   NEUROLOGIC: No tingling, numbness, weakness.  PSYCHIATRY: No anxiety or depression.   DRUG ALLERGIES:  No Known Allergies  VITALS:  Blood pressure 112/63, pulse 84, temperature 98.1 F (36.7 C), temperature source Oral, resp. rate 18, height 5\' 8"  (1.727 m), weight 153 lb 11.2 oz (69.718 kg), SpO2 98 %.  PHYSICAL EXAMINATION:  GENERAL:  60 y.o.-year-old patient lying in the bed with no acute distress.  EYES: Pupils equal, round, reactive to light and accommodation. No scleral icterus. Extraocular muscles intact.  HEENT: Head atraumatic, normocephalic. Oropharynx and nasopharynx clear.  NECK:  Supple, no jugular venous distention. No thyroid enlargement, no tenderness.  LUNGS: Normal breath sounds bilaterally, no wheezing, rales,rhonchi or crepitation. No use of accessory muscles of respiration.  CARDIOVASCULAR: S1, S2 normal. No murmurs, rubs, or gallops.  ABDOMEN: Soft, nontender, nondistended. Bowel sounds present. No organomegaly or mass.  EXTREMITIES: No pedal edema, cyanosis, or clubbing.  NEUROLOGIC: Cranial nerves II through XII are intact. Muscle strength 4/5 in all extremities.  Sensation intact. Gait not checked.  PSYCHIATRIC: The patient is alert and oriented x 2-3.  SKIN: No obvious rash, lesion, or ulcer.    LABORATORY PANEL:   CBC  Recent Labs Lab 03/01/16 0421  WBC 13.3*  HGB 12.6*  HCT 38.3*  PLT 216   ------------------------------------------------------------------------------------------------------------------  Chemistries   Recent Labs Lab 03/01/16 0421  NA 141  K 3.6  CL 109  CO2 25  GLUCOSE 86  BUN 13  CREATININE 1.00  CALCIUM 9.1   ------------------------------------------------------------------------------------------------------------------  Cardiac Enzymes No results for input(s): TROPONINI in the last 168 hours. ------------------------------------------------------------------------------------------------------------------  RADIOLOGY:  Ct Head Wo Contrast  02/29/2016  CLINICAL DATA:  Headache status post accident. EXAM: CT HEAD WITHOUT CONTRAST TECHNIQUE: Contiguous axial images were obtained from the base of the skull through the vertex without intravenous contrast. COMPARISON:  02/26/2016 FINDINGS: Brain: No evidence of acute infarction, hemorrhage, extra-axial collection, ventriculomegaly, or mass effect. Vascular: No hyperdense vessel or unexpected calcification. Skull: Negative for fracture or focal lesion. Sinuses/Orbits: No acute findings. Other: None. IMPRESSION: No acute intracranial abnormality. Electronically Signed   By: Ted Mcalpineobrinka  Dimitrova M.D.   On: 02/29/2016 14:59    EKG:   Orders placed or performed during the hospital encounter of 02/29/16  . ED EKG  . ED EKG    ASSESSMENT AND PLAN:   60 year old male with past medical history of COPD, DJD, anxiety, benzodiazepine abuse who presents to the hospital due to delusions, hallucinations and noted to be in benzodiazepine withdrawal.  1. Altered mental status-patient presented to the hospital due to delusions, hallucinations. This is secondary to  benzodiazepine withdrawal on CIWA protocol, Librium taper. Monitor mental status.  2. Benzodiazepine withdrawal-this is a cause of patient's  altered mental status. continue Librium, CIWA protocol. F/u psychiatric consult.  3. Diabetes type 2 without complication-continue glipizide, Levemir.  4. Diabetic neuropathy-continue gabapentin.  5. Anxiety-continue bupropion, BuSpar.  6. History of seizures-continue Dilantin.  UTI with leukocytosis, per UA, f/u CBC and U/C, start rocephin.  All the records are reviewed and case discussed with Care Management/Social Workerr. Management plans discussed with the patient, family and they are in agreement.  CODE STATUS: Full code  TOTAL TIME TAKING CARE OF THIS PATIENT: 37 minutes.  Greater than 50% time was spent on coordination of care and face-to-face counseling.  POSSIBLE D/C IN 1-2 DAYS, DEPENDING ON CLINICAL CONDITION.   Shaune Pollack M.D on 03/01/2016 at 1:47 PM  Between 7am to 6pm - Pager - [phone]  After 6pm go to [email or web address] - password [email or web address] [facility]  [place] [facility]  Office  [phone]  CC: Primary care physician; No primary care provider on file.

## 2016-03-01 NOTE — Evaluation (Signed)
Physical Therapy Evaluation Patient Details Name: [name] MRN: [id number] DOB: 1956-02-26 Today's Date: 03/01/2016   History of Present Illness  Pt is admitted for hallucinations. Pt with history of COPD, diabetes, DDD, and anxiety. Pt with complaints of confusion and benzo withdrawl.   Clinical Impression  Pt admitted for hallucinations. Pt demonstrates all bed mobility/transfers/ambulation at baseline level. Pt does not require any further PT needs at this time. Pt has higher level balance deficits, however reports this is his baseline. Pt will be dc in house and does not require follow up. RN aware. Will dc current orders.     Follow Up Recommendations No PT follow up    Equipment Recommendations       Recommendations for Other Services       Precautions / Restrictions Precautions Precautions: Fall Restrictions Weight Bearing Restrictions: No      Mobility  Bed Mobility Overal bed mobility: Independent             General bed mobility comments: Safe technique performed  Transfers Overall transfer level: Independent               General transfer comment: safe technique performed without AD. Pt with slight unsteadiness with static standing.  Ambulation/Gait Ambulation/Gait assistance: Min guard Ambulation Distance (Feet): 200 Feet Assistive device:  (IV pole) Gait Pattern/deviations: Step-through pattern     General Gait Details: ambulated holding the IV pole. Pt with slight unsteadiness during ambulation. 2 LOB noted, however pt able to self correct. Reciprocal gait pattern performed.  Stairs            Wheelchair Mobility    Modified Rankin (Stroke Patients Only)       Balance Overall balance assessment: History of Falls                                           Pertinent Vitals/Pain Pain Assessment: No/denies pain    Home Living Family/patient expects to be discharged to:: Private residence Living  Arrangements: Alone Available Help at Discharge: Family Type of Home: House Home Access: Level entry     Home Layout: One level Home Equipment: None      Prior Function Level of Independence: Independent         Comments: has history of falls, recent abrasion on head     Hand Dominance        Extremity/Trunk Assessment   Upper Extremity Assessment: Overall WFL for tasks assessed           Lower Extremity Assessment: Overall WFL for tasks assessed         Communication   Communication: No difficulties  Cognition Arousal/Alertness: Awake/alert Behavior During Therapy: Impulsive Overall Cognitive Status: Within Functional Limits for tasks assessed                      General Comments      Exercises        Assessment/Plan    PT Assessment Patent does not need any further PT services  PT Diagnosis     PT Problem List    PT Treatment Interventions     PT Goals (Current goals can be found in the Care Plan section) Acute Rehab PT Goals Patient Stated Goal: to go home PT Goal Formulation: All assessment and education complete, DC therapy Time For Goal Achievement: 03/01/16 Potential to  Achieve Goals: Good    Frequency     Barriers to discharge        Co-evaluation               End of Session Equipment Utilized During Treatment: Gait belt Activity Tolerance: Patient tolerated treatment well Patient left: in chair;with chair alarm set Nurse Communication: Mobility status    Functional Assessment Tool Used: clinical judgement Functional Limitation: Mobility: Walking and moving around Mobility: Walking and Moving Around Current Status [phone](G8978): 0 percent impaired, limited or restricted Mobility: Walking and Moving Around Goal Status [phone](G8979): 0 percent impaired, limited or restricted Mobility: Walking and Moving Around Discharge Status [phone](G8980): 0 percent impaired, limited or restricted    Time: 1330-1345 PT Time Calculation  (min) (ACUTE ONLY): 15 min   Charges:   PT Evaluation $PT Eval Low Complexity: 1 Procedure     PT G Codes:   PT G-Codes **NOT FOR INPATIENT CLASS** Functional Assessment Tool Used: clinical judgement Functional Limitation: Mobility: Walking and moving around Mobility: Walking and Moving Around Current Status ([id number](G8978): 0 percent impaired, limited or restricted Mobility: Walking and Moving Around Goal Status ([id number](G8979): 0 percent impaired, limited or restricted Mobility: Walking and Moving Around Discharge Status [phone](G8980): 0 percent impaired, limited or restricted    Ronak Duquette 03/01/2016, 3:39 PM  Elizabeth PalauStephanie Palmyra Rogacki, PT, DPT [phone]

## 2016-03-01 NOTE — Progress Notes (Signed)
At 1617 patient blood suagr was 59. RN gave a cup of orange juice. Now patient blood sugar is 74.   [name]

## 2016-03-02 LAB — CBC
HCT: 36.3 % — ABNORMAL LOW (ref 40.0–52.0)
Hemoglobin: 12.4 g/dL — ABNORMAL LOW (ref 13.0–18.0)
MCH: 29.6 pg (ref 26.0–34.0)
MCHC: 34.1 g/dL (ref 32.0–36.0)
MCV: 86.8 fL (ref 80.0–100.0)
PLATELETS: 208 10*3/uL (ref 150–440)
RBC: 4.18 MIL/uL — ABNORMAL LOW (ref 4.40–5.90)
RDW: 13.8 % (ref 11.5–14.5)
WBC: 11 10*3/uL — AB (ref 3.8–10.6)

## 2016-03-02 LAB — ALBUMIN: Albumin: 3.2 g/dL — ABNORMAL LOW (ref 3.5–5.0)

## 2016-03-02 LAB — GLUCOSE, CAPILLARY
GLUCOSE-CAPILLARY: 46 mg/dL — AB (ref 65–99)
GLUCOSE-CAPILLARY: 141 mg/dL — AB (ref 65–99)
Glucose-Capillary: 66 mg/dL (ref 65–99)

## 2016-03-02 LAB — PHENYTOIN LEVEL, TOTAL: Phenytoin Lvl: 6.2 ug/mL — ABNORMAL LOW (ref 10.0–20.0)

## 2016-03-02 MED ORDER — CHLORDIAZEPOXIDE HCL 25 MG PO CAPS: 25.0000 mg | ORAL_CAPSULE | Freq: Two times a day (BID) | ORAL | Status: DC

## 2016-03-02 MED ORDER — CIPROFLOXACIN HCL 500 MG PO TABS: 500.0000 mg | ORAL_TABLET | Freq: Two times a day (BID) | ORAL | Status: DC

## 2016-03-02 MED ORDER — GABAPENTIN 300 MG PO CAPS: 300.0000 mg | ORAL_CAPSULE | Freq: Three times a day (TID) | ORAL | Status: DC

## 2016-03-02 NOTE — Discharge Planning (Addendum)
Pt IV removed.  Pt DC papers given, explained and educated.  Pt told of suggested FU appt. Scripts given.  Final dose of IV anbx given prior to DC.  RN assessment and VS revealed stability for DC to RTC.  RTC contacted to transport pt from hospital and working on arranging transport.  RTC will contact RN once transport plan has been arranged.  Pt informed.

## 2016-03-02 NOTE — Discharge Planning (Signed)
Pt left scripts in room.  No phone number in system to contact.  [facility] contacted and asked to have scripts left in ED for pick up later this evening.  Ellsworth LennoxBruce Davis from [facility] will be picking up scripts. [facility].

## 2016-03-02 NOTE — Discharge Summary (Addendum)
[facility] [place] [facility]   PATIENT NAME: [name]    MR#:  [id number]  DATE OF BIRTH:  10-09-1955  DATE OF ADMISSION:  02/29/2016 ADMITTING PHYSICIAN: Houston SirenVivek J Sainani, MD  DATE OF DISCHARGE: 03/02/2016 PRIMARY CARE PHYSICIAN: No primary care provider on file.    ADMISSION DIAGNOSIS:  Benzodiazepine withdrawal, with delirium (HCC) [F13.231]   DISCHARGE DIAGNOSIS:  Altered mental status  benzodiazepine withdrawal SECONDARY DIAGNOSIS:   Past Medical History  Diagnosis Date  . DDD (degenerative disc disease), cervical   . Diabetes mellitus without complication (HCC)   . COPD (chronic obstructive pulmonary disease) (HCC)   . Benzodiazepine withdrawal [facility](HCC)     HOSPITAL COURSE:  60 year old male with past medical history of COPD, DJD, anxiety, benzodiazepine abuse who presents to the hospital due to delusions, hallucinations and noted to be in benzodiazepine withdrawal.  1. Altered mental status-patient presented to the hospital due to delusions, hallucinations. This is secondary to benzodiazepine withdrawal on CIWA protocol, Librium taper. Monitor mental status.  2. Benzodiazepine withdrawal-this is a cause of patient's altered mental status. continue Librium, CIWA protocol. F/u psychiatric consult.  3. Diabetes type 2 without complication- treated wtih glipizide, Levemir and sliding scale. Hypoglycemia this am, glipizide is hold. Improved.  4. Diabetic neuropathy-continue gabapentin.  5. Anxiety-discontinued bupropion by Dr. Toni Amendlapacs due to risk of seizure,  Continue BuSpar.  6. History of seizures-continue Dilantin.  UTI with leukocytosis, per UA, f/u CBC and U/C, start rocephin.   DISCHARGE CONDITIONS:   Stable, discharge to RTC today.  CONSULTS OBTAINED:  Treatment Team:  Jimmy FootmanAndrea Hernandez-Gonzalez, MD Audery AmelJohn T Clapacs, MD  DRUG ALLERGIES:  No Known Allergies  DISCHARGE MEDICATIONS:   Current Discharge Medication  List    START taking these medications   Details  ciprofloxacin (CIPRO) 500 MG tablet Take 1 tablet (500 mg total) by mouth 2 (two) times daily. Qty: 6 tablet, Refills: 0      CONTINUE these medications which have CHANGED   Details  chlordiazePOXIDE (LIBRIUM) 25 MG capsule Take 1 capsule (25 mg total) by mouth every 12 (twelve) hours. Qty: 4 capsule, Refills: 0    gabapentin (NEURONTIN) 300 MG capsule Take 1 capsule (300 mg total) by mouth 3 (three) times daily. Qty: 90 capsule, Refills: 0      CONTINUE these medications which have NOT CHANGED   Details  busPIRone (BUSPAR) 15 MG tablet Take 15 mg by mouth 2 (two) times daily.    folic acid (FOLVITE) 1 MG tablet Take 1 mg by mouth daily.    insulin detemir (LEVEMIR) 100 UNIT/ML injection Inject 35 Units into the skin at bedtime.    Multiple Vitamin (MULTIVITAMIN) tablet Take 1 tablet by mouth daily.    phenytoin (DILANTIN) 100 MG ER capsule Take 200 mg by mouth 2 (two) times daily.    QUEtiapine (SEROQUEL XR) 400 MG 24 hr tablet Take 800 mg by mouth at bedtime.    thiamine (VITAMIN B-1) 100 MG tablet Take 100 mg by mouth daily.    beclomethasone (QVAR) 40 MCG/ACT inhaler Inhale 2 puffs into the lungs 2 (two) times daily. Qty: 1 Inhaler, Refills: 12    docusate sodium (COLACE) 100 MG capsule Take 1 capsule (100 mg total) by mouth 2 (two) times daily as needed for constipation. Qty: 60 capsule, Refills: 0    meloxicam (MOBIC) 7.5 MG tablet Take 1 tablet (7.5 mg total) by mouth daily. Qty: 10 tablet, Refills: 0    polyethylene glycol powder (  GLYCOLAX/MIRALAX) powder Take 17 g by mouth daily. Qty: 255 g, Refills: 0      STOP taking these medications     buPROPion (WELLBUTRIN) 75 MG tablet      glipiZIDE (GLUCOTROL) 10 MG tablet      predniSONE (DELTASONE) 20 MG tablet          DISCHARGE INSTRUCTIONS:    If you experience worsening of your admission symptoms, develop shortness of breath, life threatening  emergency, suicidal or homicidal thoughts you must seek medical attention immediately by calling 911 or calling your MD immediately  if symptoms less severe.  You Must read complete instructions/literature along with all the possible adverse reactions/side effects for all the Medicines you take and that have been prescribed to you. Take any new Medicines after you have completely understood and accept all the possible adverse reactions/side effects.   Please note  You were cared for by a hospitalist during your hospital stay. If you have any questions about your discharge medications or the care you received while you were in the hospital after you are discharged, you can call the unit and asked to speak with the hospitalist on call if the hospitalist that took care of you is not available. Once you are discharged, your primary care physician will handle any further medical issues. Please note that NO REFILLS for any discharge medications will be authorized once you are discharged, as it is imperative that you return to your primary care physician (or establish a relationship with a primary care physician if you do not have one) for your aftercare needs so that they can reassess your need for medications and monitor your lab values.    Today   SUBJECTIVE    No complaint.  VITAL SIGNS:  Blood pressure 107/59, pulse 91, temperature 98.3 F (36.8 C), temperature source Oral, resp. rate 20, height 5\' 8"  (1.727 m), weight 153 lb 11.2 oz (69.718 kg), SpO2 96 %.  I/O:   Intake/Output Summary (Last 24 hours) at 03/02/16 1232 Last data filed at 03/02/16 1011  Gross per 24 hour  Intake 4231.66 ml  Output   2725 ml  Net 1506.66 ml    PHYSICAL EXAMINATION:  GENERAL:  60 y.o.-year-old patient lying in the bed with no acute distress.  EYES: Pupils equal, round, reactive to light and accommodation. No scleral icterus. Extraocular muscles intact.  HEENT: Head atraumatic, normocephalic. Oropharynx  and nasopharynx clear.  NECK:  Supple, no jugular venous distention. No thyroid enlargement, no tenderness.  LUNGS: Normal breath sounds bilaterally, no wheezing, rales,rhonchi or crepitation. No use of accessory muscles of respiration.  CARDIOVASCULAR: S1, S2 normal. No murmurs, rubs, or gallops.  ABDOMEN: Soft, non-tender, non-distended. Bowel sounds present. No organomegaly or mass.  EXTREMITIES: No pedal edema, cyanosis, or clubbing.  NEUROLOGIC: Cranial nerves II through XII are intact. Muscle strength 5/5 in all extremities. Sensation intact. Gait not checked.  PSYCHIATRIC: The patient is alert and oriented x 3.  SKIN: No obvious rash, lesion, or ulcer.   DATA REVIEW:   CBC  Recent Labs Lab 03/02/16 0356  WBC 11.0*  HGB 12.4*  HCT 36.3*  PLT 208    Chemistries   Recent Labs Lab 03/01/16 0421  NA 141  K 3.6  CL 109  CO2 25  GLUCOSE 86  BUN 13  CREATININE 1.00  CALCIUM 9.1    Cardiac Enzymes No results for input(s): TROPONINI in the last 168 hours.  Microbiology Results  Results for orders placed or  performed during the hospital encounter of 06/06/07  Urine culture     Status: None   Collection Time: 06/06/07  4:32 PM  Result Value Ref Range Status   Specimen Description URINE, CATHETERIZED  Final   Special Requests NONE  Final   Colony Count NO GROWTH  Final   Culture NO GROWTH  Final   Report Status 06/08/2007 FINAL  Final  Culture, blood (routine x 2)     Status: None   Collection Time: 06/06/07  8:45 PM  Result Value Ref Range Status   Specimen Description BLOOD RIGHT HAND  Final   Special Requests BOTTLES DRAWN AEROBIC AND ANAEROBIC 5CC  Final   Culture NO GROWTH 5 DAYS  Final   Report Status 06/13/2007 FINAL  Final  Wound culture     Status: None   Collection Time: 06/08/07  6:22 PM  Result Value Ref Range Status   Specimen Description PENIS  Final   Special Requests NONE  Final   Gram Stain   Final    NO WBC SEEN RARE SQUAMOUS EPITHELIAL  CELLS PRESENT NO ORGANISMS SEEN   Culture   Final    MULTIPLE ORGANISMS PRESENT, NONE PREDOMINANT Note: NO GROUP A STREP (S.PYOGENES) ISOLATED NO STAPHYLOCOCCUS AUREUS ISOLATED   Report Status 06/11/2007 FINAL  Final    RADIOLOGY:  Ct Head Wo Contrast  02/29/2016  CLINICAL DATA:  Headache status post accident. EXAM: CT HEAD WITHOUT CONTRAST TECHNIQUE: Contiguous axial images were obtained from the base of the skull through the vertex without intravenous contrast. COMPARISON:  02/26/2016 FINDINGS: Brain: No evidence of acute infarction, hemorrhage, extra-axial collection, ventriculomegaly, or mass effect. Vascular: No hyperdense vessel or unexpected calcification. Skull: Negative for fracture or focal lesion. Sinuses/Orbits: No acute findings. Other: None. IMPRESSION: No acute intracranial abnormality. Electronically Signed   By: Ted Mcalpine M.D.   On: 02/29/2016 14:59        Management plans discussed with the patient, family and they are in agreement.  CODE STATUS:     Code Status Orders        Start     Ordered   02/29/16 1853  Full code   Continuous     02/29/16 1852    Code Status History    Date Active Date Inactive Code Status Order ID Comments User Context   This patient has a current code status but no historical code status.      TOTAL TIME TAKING CARE OF THIS PATIENT: 33 minutes.    Shaune Pollack M.D on 03/02/2016 at 12:32 PM  Between 7am to 6pm - Pager - [phone]  After 6pm go to [email or web address] - password [email or web address] [facility]  [place] [facility]  Office  [phone]  CC: Primary care physician; No primary care provider on file.

## 2016-03-02 NOTE — Discharge Instructions (Signed)
Heart healthy diet. °Activity as tolerated. °Smoking cessation. °

## 2016-03-02 NOTE — Progress Notes (Signed)
Inpatient Diabetes Program Recommendations  AACE/ADA: New Consensus Statement on Inpatient Glycemic Control (2015)  Target Ranges:  Prepandial:   less than 140 mg/dL      Peak postprandial:   less than 180 mg/dL (1-2 hours)      Critically ill patients:  140 - 180 mg/dL   Results for Ledell NossSLOAN, Manuelito C (MRN [id number]) as of 03/02/2016 09:00  Ref. Range 03/01/2016 07:30 03/01/2016 11:00 03/01/2016 16:17 03/01/2016 17:05 03/01/2016 21:03 03/02/2016 07:37  Glucose-Capillary Latest Ref Range: 65-99 mg/dL 71 409134 (H) 59 (L) 74 84 46 (L)   Review of Glycemic Control  Diabetes history: DM2 Outpatient Diabetes medications: Glipizide 10 mg daily, Levemir 35 units QHS Current orders for Inpatient glycemic control: Levemir 35 units QHS, Glipizide 10 mg daily, Novolog 0-9 units TID with meals, Novolog 0-5 units QHS  Inpatient Diabetes Program Recommendations: Insulin - Basal: Fasting glucose 46 mg/dl this morning. Recommend that Glipizide be discontinued as an inpatient. If hypoglycemia continues after stopping Glipizide, may want to consider decreasing Levemir to 30 units QHS. Oral Agents: Please consider discontinuing Glipizide while inpatient.  Thanks, Orlando PennerMarie Uthman Mroczkowski, RN, MSN, CDE Diabetes Coordinator Inpatient Diabetes Program [phone] (Team Pager from 8am to 5pm) [phone] ([facility] office) [phone] [facility] office) [phone] [facility] office)

## 2016-03-02 NOTE — Discharge Planning (Signed)
RTC returned call and indicated earliest ride could arrive to transport pt would be 1700.  Pt informed and insisted on going outside for cigarette, then waiting in Lobby for ride to arrive.  Since lines removed, pt dressed and has already signed DC papers, RN agreed to take pt outside for cigarette -  if pt would return to room and wait patiently for ride.  Patient agreed.

## 2016-03-02 NOTE — Discharge Planning (Signed)
Pt picked up by RTC at front of Hospital.

## 2016-03-03 LAB — URINE CULTURE
Culture: >=100000 — AB
SPECIAL REQUESTS: NORMAL

## 2016-03-28 ENCOUNTER — Encounter (HOSPITAL_BASED_OUTPATIENT_CLINIC_OR_DEPARTMENT_OTHER): Payer: Self-pay | Admitting: *Deleted

## 2016-03-28 ENCOUNTER — Emergency Department (HOSPITAL_BASED_OUTPATIENT_CLINIC_OR_DEPARTMENT_OTHER)
Admission: EM | Admit: 2016-03-28 | Discharge: 2016-03-28 | Disposition: A | Discharge status: Home / Self Care | Payer: Medicaid Other | Attending: Emergency Medicine | Admitting: Emergency Medicine

## 2016-03-28 ENCOUNTER — Emergency Department (HOSPITAL_BASED_OUTPATIENT_CLINIC_OR_DEPARTMENT_OTHER): Payer: Medicaid Other

## 2016-03-28 DIAGNOSIS — M542 Cervicalgia: Secondary | ICD-10-CM | POA: Diagnosis not present

## 2016-03-28 DIAGNOSIS — F17210 Nicotine dependence, cigarettes, uncomplicated: Secondary | ICD-10-CM | POA: Diagnosis not present

## 2016-03-28 DIAGNOSIS — J449 Chronic obstructive pulmonary disease, unspecified: Secondary | ICD-10-CM | POA: Diagnosis not present

## 2016-03-28 DIAGNOSIS — R200 Anesthesia of skin: Secondary | ICD-10-CM | POA: Insufficient documentation

## 2016-03-28 DIAGNOSIS — Z7984 Long term (current) use of oral hypoglycemic drugs: Secondary | ICD-10-CM | POA: Diagnosis not present

## 2016-03-28 DIAGNOSIS — E119 Type 2 diabetes mellitus without complications: Secondary | ICD-10-CM | POA: Diagnosis not present

## 2016-03-28 DIAGNOSIS — Z79899 Other long term (current) drug therapy: Secondary | ICD-10-CM | POA: Diagnosis not present

## 2016-03-28 HISTORY — DX: Unspecified convulsions: R56.9

## 2016-03-28 MED ORDER — NAPROXEN 500 MG PO TABS: 500.0000 mg | ORAL_TABLET | Freq: Two times a day (BID) | ORAL | Status: DC

## 2016-03-28 MED ORDER — KETOROLAC TROMETHAMINE 60 MG/2ML IM SOLN
60.0000 mg | Freq: Once | INTRAMUSCULAR | Status: AC
  Administered 2016-03-28: 60 mg via INTRAMUSCULAR
  Filled 2016-03-28: qty 2

## 2016-03-28 NOTE — ED Provider Notes (Signed)
CSN: [id number]     Arrival date & time 03/28/16  1029 History   First MD Initiated Contact with Patient 03/28/16 1052     Chief Complaint  Patient presents with  . Arm Pain   HPI  [name] is a 60 year old male with a past medical history of COPD, diabetes, cervical degenerative disc disease and opioid abuse presenting with neck pain. Onset of symptoms was 1 week ago. The pain originates in the right side of his neck and radiates into his shoulder and to the right elbow. The pain has been constant since onset and is exacerbated by arm movement. The pain is described as sharp and shooting. Denies weakness of the right upper extremity. He notes that the anterior surface of the forearm feels numb over his surgical scar. Reports remote history of forearm fracture requiring hardware insertion. He states this numbness is new over the past week. Patient has a history of cervical fusion surgery for chronic neck pain. Patient states he has had intermittent pain in the right side of his neck since the surgery. He has taken ibuprofen at home without relief. No other complaints today.  Past Medical History  Diagnosis Date  . DDD (degenerative disc disease), cervical   . Diabetes mellitus without complication (HCC)   . COPD (chronic obstructive pulmonary disease) (HCC)   . Benzodiazepine withdrawal (HCC)   . Seizures (HCC)     benzo withdrawal.   Past Surgical History  Procedure Laterality Date  . Back surgery    . Neck surgery     Family History  Problem Relation Age of Onset  . Breast cancer Mother   . Liver cancer Father    Social History  Substance Use Topics  . Smoking status: Current Every Day Smoker -- 1.00 packs/day for 40 years    Types: Cigarettes  . Smokeless tobacco: None  . Alcohol Use: No    Review of Systems  All other systems reviewed and are negative.     Allergies  Review of patient's allergies indicates no known allergies.  Home Medications   Prior to Admission  medications   Medication Sig Start Date End Date Taking? Authorizing Provider  busPIRone (BUSPAR) 15 MG tablet Take 15 mg by mouth 2 (two) times daily.   Yes Historical Provider, MD  carbamazepine (TEGRETOL) 200 MG tablet Take 200 mg by mouth 3 (three) times daily.   Yes Historical Provider, MD  citalopram (CELEXA) 40 MG tablet Take 40 mg by mouth daily.   Yes Historical Provider, MD  glipiZIDE (GLUCOTROL) 10 MG tablet Take 10 mg by mouth daily before breakfast.   Yes Historical Provider, MD  linaclotide (LINZESS) 290 MCG CAPS capsule Take 290 mcg by mouth daily before breakfast.   Yes Historical Provider, MD  meloxicam (MOBIC) 7.5 MG tablet Take 1 tablet (7.5 mg total) by mouth daily. 09/30/12  Yes Renne Crigler, PA-C  omeprazole (PRILOSEC) 40 MG capsule Take 40 mg by mouth daily.   Yes Historical Provider, MD  QUEtiapine (SEROQUEL XR) 400 MG 24 hr tablet Take 800 mg by mouth at bedtime.   Yes Historical Provider, MD  simvastatin (ZOCOR) 40 MG tablet Take 40 mg by mouth daily.   Yes Historical Provider, MD  beclomethasone (QVAR) 40 MCG/ACT inhaler Inhale 2 puffs into the lungs 2 (two) times daily. 10/30/12   Kaitlyn Szekalski, PA-C  chlordiazePOXIDE (LIBRIUM) 25 MG capsule Take 1 capsule (25 mg total) by mouth every 12 (twelve) hours. 03/02/16   Shaune Pollack, MD  ciprofloxacin (CIPRO) 500 MG tablet Take 1 tablet (500 mg total) by mouth 2 (two) times daily. 03/02/16   Shaune PollackQing Chen, MD  docusate sodium (COLACE) 100 MG capsule Take 1 capsule (100 mg total) by mouth 2 (two) times daily as needed for constipation. 10/16/12   Loren Raceravid Yelverton, MD  folic acid (FOLVITE) 1 MG tablet Take 1 mg by mouth daily.    Historical Provider, MD  gabapentin (NEURONTIN) 300 MG capsule Take 1 capsule (300 mg total) by mouth 3 (three) times daily. Patient taking differently: Take 600 mg by mouth 3 (three) times daily.  03/02/16   Shaune PollackQing Chen, MD  insulin detemir (LEVEMIR) 100 UNIT/ML injection Inject 35 Units into the skin at bedtime.     Historical Provider, MD  Multiple Vitamin (MULTIVITAMIN) tablet Take 1 tablet by mouth daily.    Historical Provider, MD  naproxen (NAPROSYN) 500 MG tablet Take 1 tablet (500 mg total) by mouth 2 (two) times daily. 03/28/16   Shaneen Reeser, PA-C  phenytoin (DILANTIN) 100 MG ER capsule Take 200 mg by mouth 2 (two) times daily.    Historical Provider, MD  polyethylene glycol powder (GLYCOLAX/MIRALAX) powder Take 17 g by mouth daily. 10/16/12   Loren Raceravid Yelverton, MD  thiamine (VITAMIN B-1) 100 MG tablet Take 100 mg by mouth daily.    Historical Provider, MD   BP 133/74 mmHg  Pulse 78  Temp(Src) 98.1 F (36.7 C) (Oral)  Resp 20  Ht 5\' 8"  (1.727 m)  Wt 74.844 kg  BMI 25.09 kg/m2  SpO2 100% Physical Exam  Constitutional: He appears well-developed and well-nourished. No distress.  HENT:  Head: Normocephalic and atraumatic.  Right Ear: External ear normal.  Left Ear: External ear normal.  Eyes: Conjunctivae are normal. Right eye exhibits no discharge. Left eye exhibits no discharge. No scleral icterus.  Neck: Normal range of motion. Neck supple. Muscular tenderness present. Normal range of motion present.    Tenderness to palpation of the right neck. No tenderness over the cervical spine. Full range motion of the neck intact. Patient endorses pain on rotation of the neck to the right.  Cardiovascular: Normal rate and intact distal pulses.   Radial pulse palpable  Pulmonary/Chest: Effort normal.  Musculoskeletal: Normal range of motion.  Mild tenderness to palpation from the right neck to right shoulder, humeral shaft and elbow. No bony deformities of the right upper extremity. Patient resists active range of motion past 90 extension of the shoulder citing pain. Full passive range of motion intact. Full range of motion of the elbow, wrist and digits intact.  Neurological: He is alert. Coordination normal.  5/5 strength of the bilateral upper extremities. Patient endorses loss of sensation to  soft touch along the palmar forearm over a surgical scar. Full sensation of the dorsal forearm and remaining right upper extremity.  Skin: Skin is warm and dry.  Psychiatric: He has a normal mood and affect. His behavior is normal.  Nursing note and vitals reviewed.   ED Course  Procedures (including critical care time) Labs Review Labs Reviewed - No data to display  Imaging Review Dg Cervical Spine Complete  03/28/2016  CLINICAL DATA:  Neck pain for a week without known injury. EXAM: CERVICAL SPINE - COMPLETE 4+ VIEW COMPARISON:  CT 02/26/2016. FINDINGS: Patient is status post ACDF at C3-4 and C4-5 with anterior plate from [id number]-C5. Solid bony fusion across the C3-4 and C4-5 interspace is noted. Alignment is preserved. Loss of intervertebral disc height seen at C5-6 and C6-7,  as before. Facets are well aligned bilaterally. No prevertebral soft tissue swelling. Normal cervical lordosis is preserved. IMPRESSION: Stable anterior fusion from C3-C5.  No new or acute findings. Electronically Signed   By: Kennith Center M.D.   On: 03/28/2016 12:01   I have personally reviewed and evaluated these images and lab results as part of my medical decision-making.   EKG Interpretation None      MDM   Final diagnoses:  Neck pain   60 year old male with PSHx of cervical fusion surgery presenting with atraumatic right sided neck pain x 1 week. Afebrile and hemodynamically stable. Tenderness to palpation of right neck musculature and right upper arm. Full passive ROM intact. No deformity. No strength deficits. Pt endorses loss of sensation along scar tissue of palmar forearm. Radial pulse palpable. Imaging of cervical spine shows stable fusion without acute findings. Discussed with neurosurgeon on call, Dr. Bevely Palmer, who recommends outpatient neurosurgery follow up. Pt has history of opiate abuse; will avoid narcotics and d.c with naprosyn. Return precautions given in discharge paperwork and discussed with pt at  bedside. Pt stable for discharge     Alveta Heimlich, PA-C 03/28/16 1320  Glynn Octave, MD 03/28/16 [phone]

## 2016-03-28 NOTE — Discharge Instructions (Signed)
Radicular Pain °Radicular pain in either the arm or leg is usually from a bulging or herniated disk in the spine. A piece of the herniated disk may press against the nerves as the nerves exit the spine. This causes pain which is felt at the tips of the nerves down the arm or leg. Other causes of radicular pain may include: °· Fractures. °· Heart disease. °· Cancer. °· An abnormal and usually degenerative state of the nervous system or nerves (neuropathy). °Diagnosis may require CT or MRI scanning to determine the primary cause.  °Nerves that start at the neck (nerve roots) may cause radicular pain in the outer shoulder and arm. It can spread down to the thumb and fingers. The symptoms vary depending on which nerve root has been affected. In most cases radicular pain improves with conservative treatment. Neck problems may require physical therapy, a neck collar, or cervical traction. Treatment may take many weeks, and surgery may be considered if the symptoms do not improve.  °Conservative treatment is also recommended for sciatica. Sciatica causes pain to radiate from the lower back or buttock area down the leg into the foot. Often there is a history of back problems. Most patients with sciatica are better after 2 to 4 weeks of rest and other supportive care. Short term bed rest can reduce the disk pressure considerably. Sitting, however, is not a good position since this increases the pressure on the disk. You should avoid bending, lifting, and all other activities which make the problem worse. Traction can be used in severe cases. Surgery is usually reserved for patients who do not improve within the first months of treatment. °Only take over-the-counter or prescription medicines for pain, discomfort, or fever as directed by your caregiver. Narcotics and muscle relaxants may help by relieving more severe pain and spasm and by providing mild sedation. Cold or massage can give significant relief. Spinal manipulation  is not recommended. It can increase the degree of disc protrusion. Epidural steroid injections are often effective treatment for radicular pain. These injections deliver medicine to the spinal nerve in the space between the protective covering of the spinal cord and back bones (vertebrae). Your caregiver can give you more information about steroid injections. These injections are most effective when given within two weeks of the onset of pain.  °You should see your caregiver for follow up care as recommended. A program for neck and back injury rehabilitation with stretching and strengthening exercises is an important part of management.  °SEEK IMMEDIATE MEDICAL CARE IF: °· You develop increased pain, weakness, or numbness in your arm or leg. °· You develop difficulty with bladder or bowel control. °· You develop abdominal pain. °  °This information is not intended to replace advice given to you by your health care provider. Make sure you discuss any questions you have with your health care provider. °  °Document Released: 10/07/2004 Document Revised: 09/20/2014 Document Reviewed: 03/26/2015 °Elsevier Interactive Patient Education ©2016 [employer] ° °

## 2016-03-28 NOTE — ED Notes (Signed)
Patient states he has had right arm pain for one week.  States the pain starts in his right clavicle and radiates down the to the right elbow.  No known injury.

## 2016-08-08 ENCOUNTER — Encounter (HOSPITAL_COMMUNITY): Payer: Self-pay

## 2016-08-08 ENCOUNTER — Emergency Department (HOSPITAL_COMMUNITY)
Admission: EM | Admit: 2016-08-08 | Discharge: 2016-08-08 | Disposition: A | Discharge status: Home / Self Care | Payer: Medicaid Other | Attending: Emergency Medicine | Admitting: Emergency Medicine

## 2016-08-08 DIAGNOSIS — F1010 Alcohol abuse, uncomplicated: Secondary | ICD-10-CM | POA: Diagnosis not present

## 2016-08-08 DIAGNOSIS — J449 Chronic obstructive pulmonary disease, unspecified: Secondary | ICD-10-CM | POA: Insufficient documentation

## 2016-08-08 DIAGNOSIS — Z79899 Other long term (current) drug therapy: Secondary | ICD-10-CM | POA: Insufficient documentation

## 2016-08-08 DIAGNOSIS — F17210 Nicotine dependence, cigarettes, uncomplicated: Secondary | ICD-10-CM | POA: Diagnosis not present

## 2016-08-08 DIAGNOSIS — E871 Hypo-osmolality and hyponatremia: Secondary | ICD-10-CM | POA: Insufficient documentation

## 2016-08-08 DIAGNOSIS — E119 Type 2 diabetes mellitus without complications: Secondary | ICD-10-CM | POA: Diagnosis not present

## 2016-08-08 HISTORY — DX: Alcohol abuse, uncomplicated: F10.10

## 2016-08-08 LAB — I-STAT CHEM 8, ED
BUN: 16 mg/dL (ref 6–20)
CALCIUM ION: 1.02 mmol/L — AB (ref 1.15–1.40)
CHLORIDE: 96 mmol/L — AB (ref 101–111)
Creatinine, Ser: 1 mg/dL (ref 0.61–1.24)
Glucose, Bld: 155 mg/dL — ABNORMAL HIGH (ref 65–99)
HEMATOCRIT: 44 % (ref 39.0–52.0)
Hemoglobin: 15 g/dL (ref 13.0–17.0)
POTASSIUM: 4.1 mmol/L (ref 3.5–5.1)
SODIUM: 133 mmol/L — AB (ref 135–145)
TCO2: 23 mmol/L (ref 0–100)

## 2016-08-08 LAB — COMPREHENSIVE METABOLIC PANEL
ALK PHOS: 108 U/L (ref 38–126)
ALT: 15 U/L — AB (ref 17–63)
AST: 19 U/L (ref 15–41)
Albumin: 4.3 g/dL (ref 3.5–5.0)
Anion gap: 19 — ABNORMAL HIGH (ref 5–15)
BUN: 15 mg/dL (ref 6–20)
CALCIUM: 9 mg/dL (ref 8.9–10.3)
CHLORIDE: 92 mmol/L — AB (ref 101–111)
CO2: 17 mmol/L — AB (ref 22–32)
CREATININE: 0.82 mg/dL (ref 0.61–1.24)
GFR calc non Af Amer: >60 mL/min (ref >60)
Glucose, Bld: 233 mg/dL — ABNORMAL HIGH (ref 65–99)
Potassium: 3.6 mmol/L (ref 3.5–5.1)
SODIUM: 128 mmol/L — AB (ref 135–145)
Total Bilirubin: 0.6 mg/dL (ref 0.3–1.2)
Total Protein: 7.6 g/dL (ref 6.5–8.1)

## 2016-08-08 LAB — ETHANOL: Alcohol, Ethyl (B): 217 mg/dL — ABNORMAL HIGH (ref <5)

## 2016-08-08 LAB — CBC
HEMATOCRIT: 41.4 % (ref 39.0–52.0)
Hemoglobin: 14.5 g/dL (ref 13.0–17.0)
MCH: 29.6 pg (ref 26.0–34.0)
MCHC: 35 g/dL (ref 30.0–36.0)
MCV: 84.5 fL (ref 78.0–100.0)
PLATELETS: 257 10*3/uL (ref 150–400)
RBC: 4.9 MIL/uL (ref 4.22–5.81)
RDW: 13.3 % (ref 11.5–15.5)
WBC: 11.8 10*3/uL — AB (ref 4.0–10.5)

## 2016-08-08 LAB — RAPID URINE DRUG SCREEN, HOSP PERFORMED
Amphetamines: NOT DETECTED
BARBITURATES: POSITIVE — AB
BENZODIAZEPINES: NOT DETECTED
Cocaine: NOT DETECTED
Opiates: NOT DETECTED
Tetrahydrocannabinol: NOT DETECTED

## 2016-08-08 MED ORDER — VITAMIN B-1 100 MG PO TABS: 100.0000 mg | ORAL_TABLET | Freq: Every day | ORAL | 0 refills | Status: AC

## 2016-08-08 MED ORDER — SODIUM CHLORIDE 0.9 % IV BOLUS (SEPSIS)
2000.0000 mL | Freq: Once | INTRAVENOUS | Status: AC
  Administered 2016-08-08: 2000 mL via INTRAVENOUS

## 2016-08-08 MED ORDER — SODIUM CHLORIDE 0.9 % IV BOLUS (SEPSIS)
1000.0000 mL | Freq: Once | INTRAVENOUS | Status: AC
  Administered 2016-08-08: 1000 mL via INTRAVENOUS

## 2016-08-08 MED ORDER — ONE-A-DAY MENS PO TABS: 1.0000 | ORAL_TABLET | Freq: Every day | ORAL | 0 refills | Status: AC

## 2016-08-08 MED ORDER — CHLORDIAZEPOXIDE HCL 25 MG PO CAPS: ORAL_CAPSULE | ORAL | 0 refills | Status: AC

## 2016-08-08 MED ORDER — FOLIC ACID 1 MG PO TABS: 1.0000 mg | ORAL_TABLET | Freq: Every day | ORAL | 0 refills | Status: AC

## 2016-08-08 NOTE — ED Notes (Signed)
Pt aware of need for urine sample.  

## 2016-08-08 NOTE — ED Triage Notes (Signed)
PT HERE FOR AN ALCOHOL LEVEL. PT/ FAMILY ARE REQUESTING THIS LEVEL, BECAUSE THE PT HAS BEEN BINGE DRINKING, HE STS HE HAD ABOUT 6 BEERS TODAY. THE LAST DRINK WAS AT 2 PM. THE FAMILY WILL BE TAKING HIM [facility] IN [place], BUT HE NEEDS THE LEVELS DRAWN BEFORE THEY WILL ACCEPT HIM. PT HAS NO COMPLAINTS AT THIS TIME.

## 2016-08-08 NOTE — ED Provider Notes (Signed)
[facility]-EMERGENCY DEPT Provider Note   CSN: [id number] Arrival date & time: 08/08/16  1624     History   Chief Complaint Chief Complaint  Patient presents with  . Alcohol Problem    HPI [name] is a 60 y.o. male.  The history is provided by the patient and a friend. No language interpreter was used.   [name] is a 60 y.o. male who presents to the Emergency Department complaining of alcohol problem.  He presents with a friend for medical clearance for alcohol detox. His last drink was about noon today. He has a history of alcohol abuse and relapsed one week ago and has been drinking continuously for the last week. He denies any SI or HI. He has been told that he has a bed available at [facility] in [place] after he is medically cleared. Past Medical History:  Diagnosis Date  . Alcohol abuse   . Benzodiazepine withdrawal (HCC)   . COPD (chronic obstructive pulmonary disease) (HCC)   . DDD (degenerative disc disease), cervical   . Diabetes mellitus without complication (HCC)   . Seizures (HCC)    benzo withdrawal.    Patient Active Problem List   Diagnosis Date Noted  . Benzodiazepine withdrawal (HCC) 02/29/2016    Past Surgical History:  Procedure Laterality Date  . BACK SURGERY    . NECK SURGERY         Home Medications    Prior to Admission medications   Medication Sig Start Date End Date Taking? Authorizing Provider  gabapentin (NEURONTIN) 300 MG capsule Take 600 mg by mouth 3 (three) times daily as needed (for pain).    Yes Historical Provider, MD  Multiple Vitamin (MULTIVITAMIN WITH MINERALS) TABS tablet Take 1 tablet by mouth daily.   Yes Historical Provider, MD  omeprazole (PRILOSEC) 40 MG capsule Take 40 mg by mouth daily.   Yes Historical Provider, MD  QUEtiapine (SEROQUEL XR) 400 MG 24 hr tablet Take 800 mg by mouth at bedtime.   Yes Historical Provider, MD  traMADol (ULTRAM) 50 MG tablet Take 100 mg by mouth 3 (three) times daily.   Yes  Historical Provider, MD  chlordiazePOXIDE (LIBRIUM) 25 MG capsule 50mg  PO TID x 1D, then 25-50mg  PO BID X 1D, then 25-50mg  PO QD X 1D 08/08/16   Tilden Fossa, MD  folic acid (FOLVITE) 1 MG tablet Take 1 tablet (1 mg total) by mouth daily. 08/08/16   Tilden Fossa, MD  multivitamin (ONE-A-DAY MEN'S) TABS tablet Take 1 tablet by mouth daily. 08/08/16   Tilden Fossa, MD  thiamine (VITAMIN B-1) 100 MG tablet Take 1 tablet (100 mg total) by mouth daily. 08/08/16   Tilden Fossa, MD    Family History Family History  Problem Relation Age of Onset  . Breast cancer Mother   . Liver cancer Father     Social History Social History  Substance Use Topics  . Smoking status: Current Every Day Smoker    Packs/day: 1.00    Years: 40.00    Types: Cigarettes  . Smokeless tobacco: Never Used  . Alcohol use 3.6 oz/week    6 Cans of beer per week     Comment: DAILY     Allergies   Patient has no known allergies.   Review of Systems Review of Systems  All other systems reviewed and are negative.    Physical Exam Updated Vital Signs BP 142/85 (BP Location: Left Arm)   Pulse 87   Temp 98.2 F (  36.8 C) (Oral)   Resp 16   Ht 5\' 9"  (1.753 m)   Wt 190 lb (86.2 kg)   SpO2 94%   BMI 28.06 kg/m   Physical Exam  Constitutional: He is oriented to person, place, and time. He appears well-developed and well-nourished.  Drowsy, appears intoxicated  HENT:  Head: Normocephalic and atraumatic.  Cardiovascular: Normal rate and regular rhythm.   No murmur heard. Pulmonary/Chest: Effort normal and breath sounds normal. No respiratory distress.  Abdominal: Soft. There is no tenderness. There is no rebound and no guarding.  Musculoskeletal: He exhibits no edema or tenderness.  Neurological: He is alert and oriented to person, place, and time.  Skin: Skin is warm and dry.  Psychiatric:  Flat affect  Nursing note and vitals reviewed.    ED Treatments / Results  Labs (all labs ordered  are listed, but only abnormal results are displayed) Labs Reviewed  COMPREHENSIVE METABOLIC PANEL - Abnormal; Notable for the following:       Result Value   Sodium 128 (*)    Chloride 92 (*)    CO2 17 (*)    Glucose, Bld 233 (*)    ALT 15 (*)    Anion gap 19 (*)    All other components within normal limits  ETHANOL - Abnormal; Notable for the following:    Alcohol, Ethyl (B) 217 (*)    All other components within normal limits  CBC - Abnormal; Notable for the following:    WBC 11.8 (*)    All other components within normal limits  RAPID URINE DRUG SCREEN, HOSP PERFORMED - Abnormal; Notable for the following:    Barbiturates POSITIVE (*)    All other components within normal limits  I-STAT CHEM 8, ED - Abnormal; Notable for the following:    Sodium 133 (*)    Chloride 96 (*)    Glucose, Bld 155 (*)    Calcium, Ion 1.02 (*)    All other components within normal limits    EKG  EKG Interpretation None       Radiology No results found.  Procedures Procedures (including critical care time)  Medications Ordered in ED Medications  sodium chloride 0.9 % bolus 1,000 mL (0 mLs Intravenous Stopped 08/08/16 2105)  sodium chloride 0.9 % bolus 2,000 mL (0 mLs Intravenous Stopped 08/08/16 2223)     Initial Impression / Assessment and Plan / ED Course  I have reviewed the triage vital signs and the nursing notes.  Pertinent labs & imaging results that were available during my care of the patient were reviewed by me and considered in my medical decision making (see chart for details).  Clinical Course    [facility] in [place].  [phone]  Patient here seeking medical clearance for alcohol detox. Labs demonstrate hyponatremia with an anion gap. He is not clinically in DKA. Providing IV fluid hydration. On repeat evaluation his labs are improved and he feels improved. The bed that was previously available in rehabilitation is no longer available, plan  to DC home with outpatient rehabilitation support resources. He was provided Librium for prevention of withdrawal symptoms at home. Return precautions discussed.  Final Clinical Impressions(s) / ED Diagnoses   Final diagnoses:  Alcohol abuse  Hyponatremia    New Prescriptions Discharge Medication List as of 08/08/2016 10:08 PM    START taking these medications   Details  !! multivitamin (ONE-A-DAY MEN'S) TABS tablet Take 1 tablet by mouth daily., Starting Sun 08/08/2016, Print     !! -  Potential duplicate medications found. Please discuss with provider.       Tilden FossaElizabeth Makinna Andy, MD 08/09/16 [phone]

## 2018-03-05 IMAGING — CT CT HEAD W/O CM
4 series · 17 of 47 positions shown, 19 images · non-contrast
Comparison: 02/26/2016

CLINICAL DATA: Headache status post accident.

EXAM:
CT HEAD WITHOUT CONTRAST
TECHNIQUE: Contiguous axial images were obtained from the base of the skull
through the vertex without intravenous contrast.

[Series 2: head wo · axial · 0.47mm/px · z∈[-246,-126]mm · 7 of 32 slices shown, 9 images]
[im 4/32  brain]
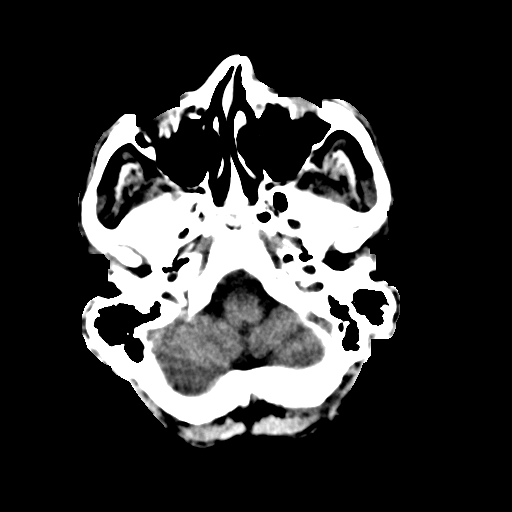
[im 4/32  bone]
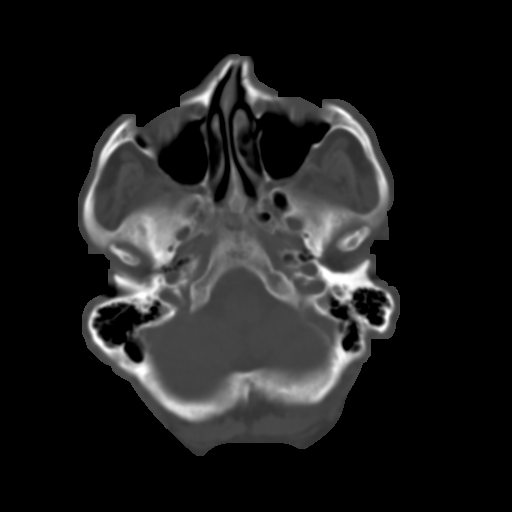
[im 8/32  brain]
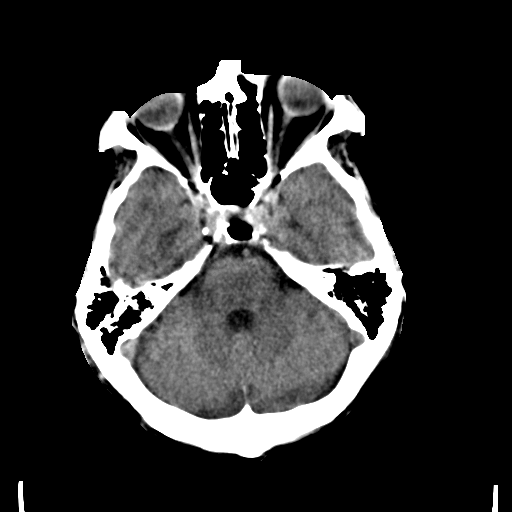
[im 12/32  brain]
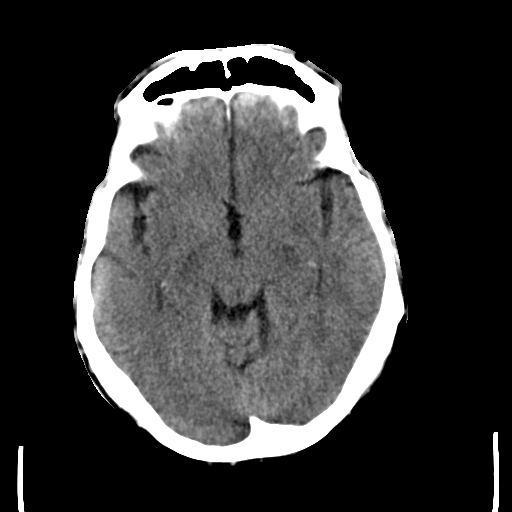
[im 16/32  brain]
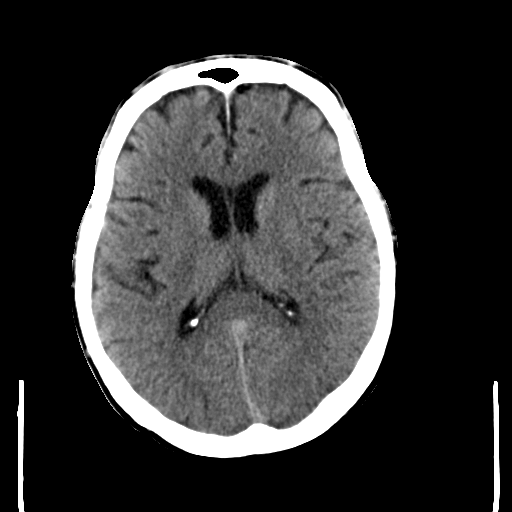
[im 20/32  brain]
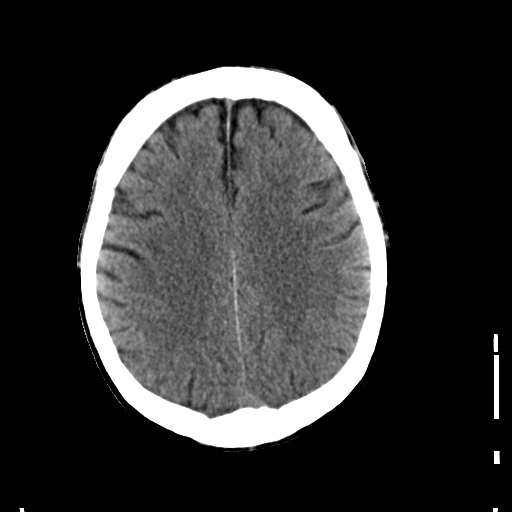
[im 20/32  bone]
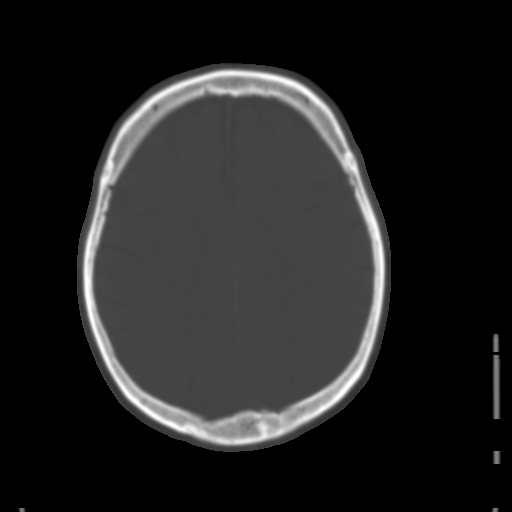
[im 24/32  brain]
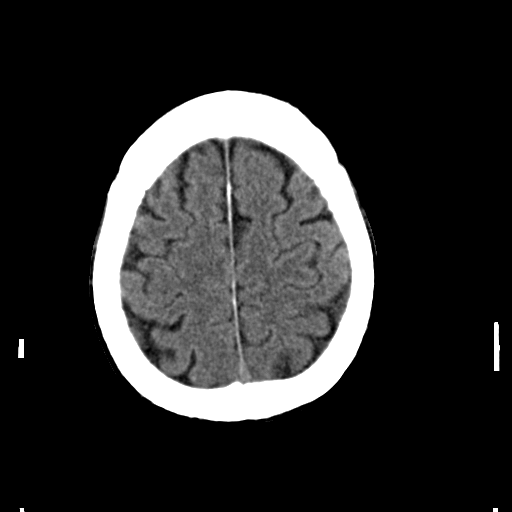
[im 28/32  brain]
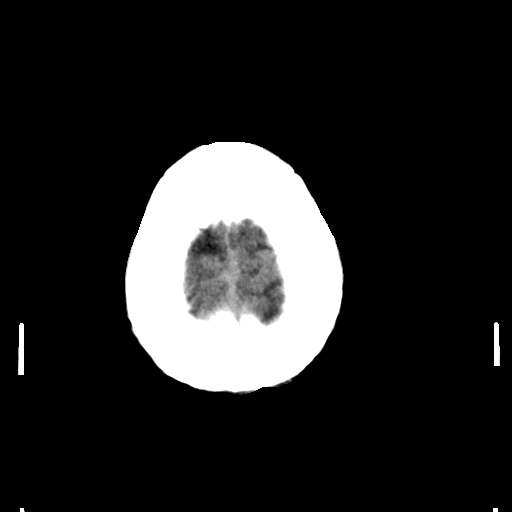

[Series 3: head bone · axial · 0.47mm/px · z∈[-247,-191]mm · 4 of 79 slices shown]
[im 8/79  bone]
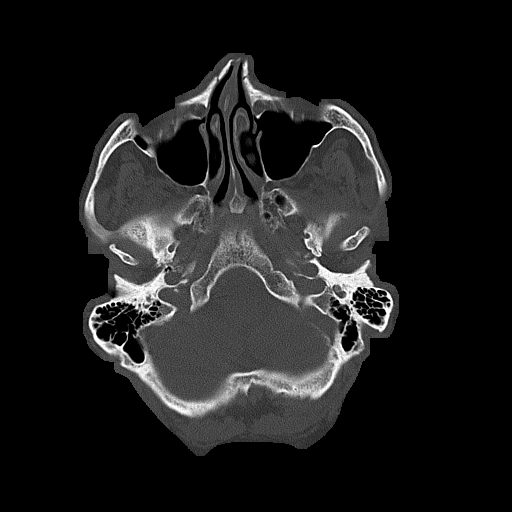
[im 16/79  bone]
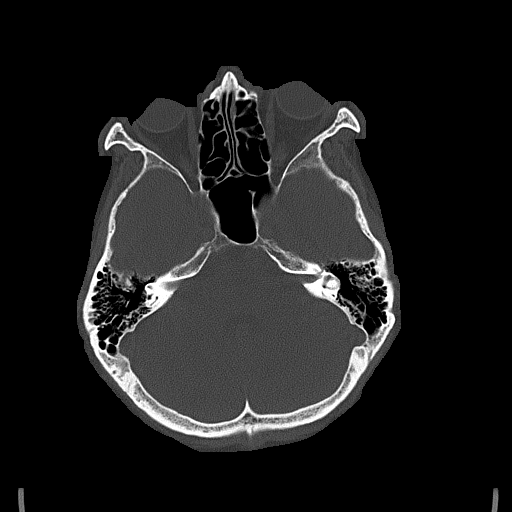
[im 24/79  bone]
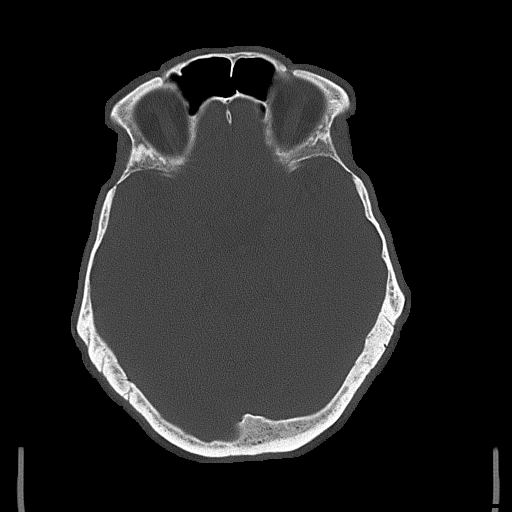
[im 36/79  bone]
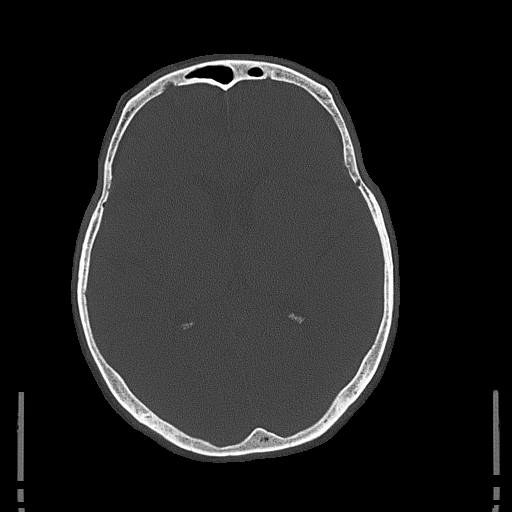

[Series 4: coronal soft tissue · coronal · 0.32mm/px · 3 of 72 slices shown]
[im 24/72  brain]
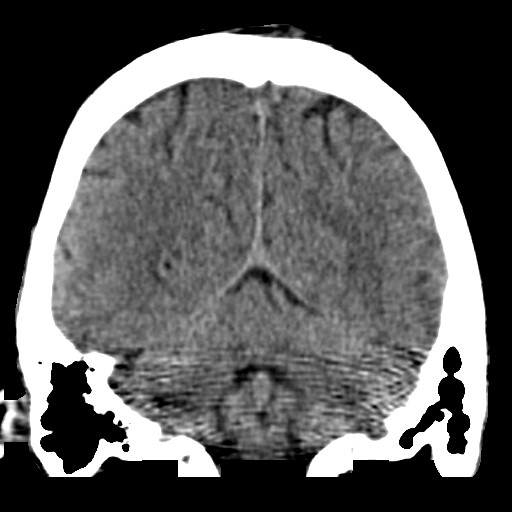
[im 32/72  brain]
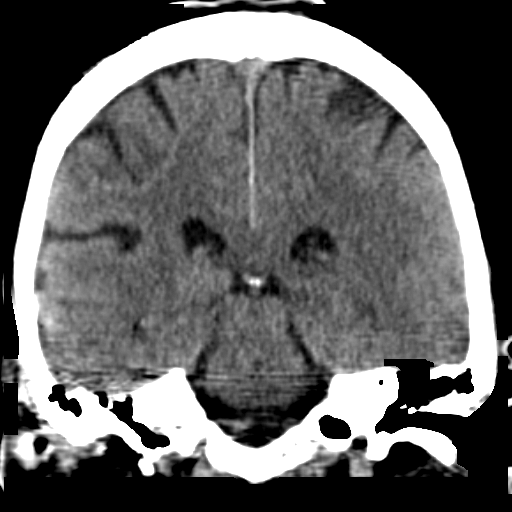
[im 40/72  brain]
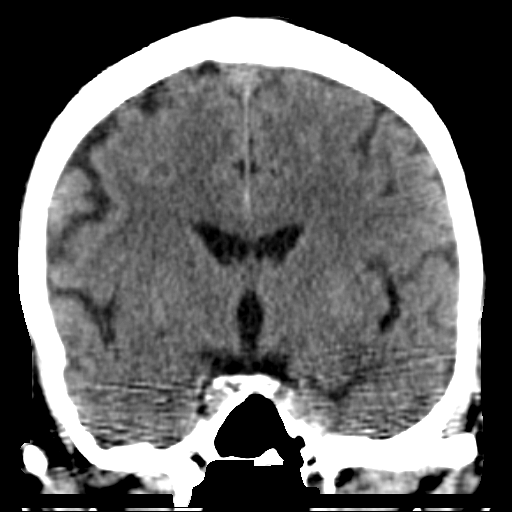

[Series 5: sagittal soft tissue · sagittal · 0.33mm/px · 3 of 53 slices shown]
[im 18/53  brain]
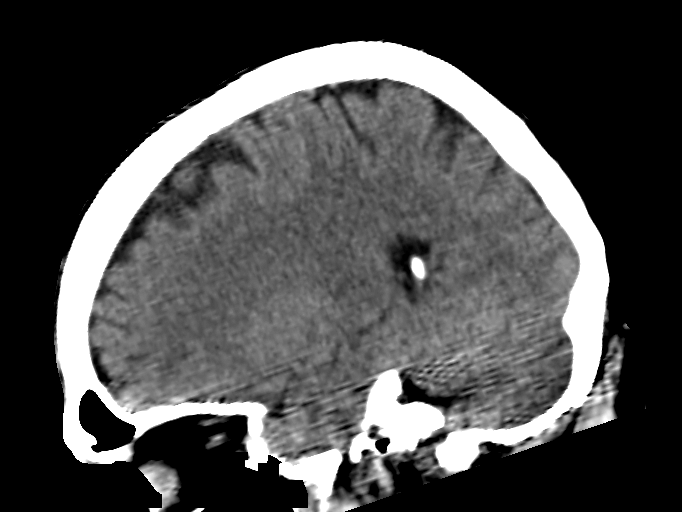
[im 27/53  brain]
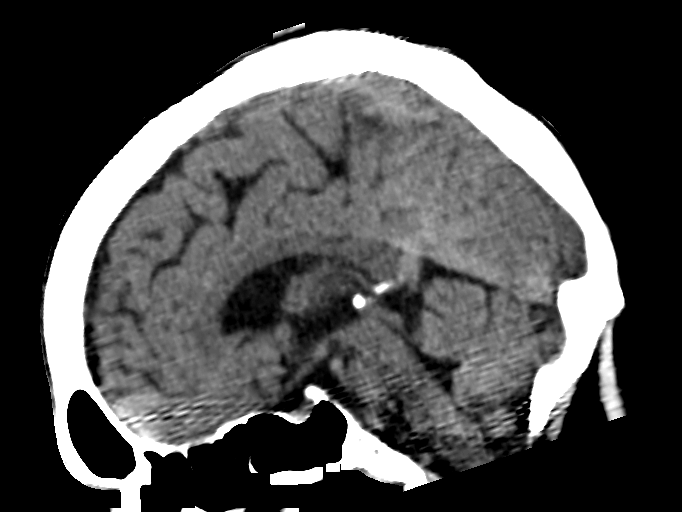
[im 35/53  brain]
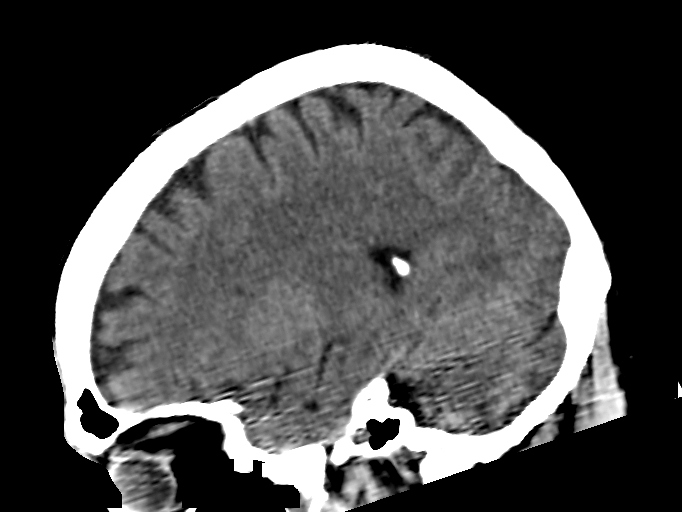

[17 of 47 positions shown; findings below may reference images not displayed]

FINDINGS: Brain: No evidence of acute infarction, hemorrhage, extra-axial
collection, ventriculomegaly, or mass effect.

Vascular: No hyperdense vessel or unexpected calcification.

Skull: Negative for fracture or focal lesion.

Sinuses/Orbits: No acute findings.

Other: None.
IMPRESSION: No acute intracranial abnormality.

## 2022-02-11 DEATH — deceased
